# Patient Record
Sex: Female | Born: 1995 | Race: Black or African American | Hispanic: No | Marital: Single | Smoking: Never smoker
Health system: Southern US, Community
[De-identification: ages and names within clinical notes are randomized; demographics above are authoritative.]

## PROBLEM LIST (undated history)

## (undated) DIAGNOSIS — J45909 Unspecified asthma, uncomplicated: Secondary | ICD-10-CM

---
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
## 2016-02-05 ENCOUNTER — Emergency Department (HOSPITAL_COMMUNITY)
Admission: EM | Admit: 2016-02-05 | Discharge: 2016-02-05 | Disposition: A | Discharge status: Home / Self Care | Payer: Medicaid Other | Attending: Emergency Medicine | Admitting: Emergency Medicine

## 2016-02-05 ENCOUNTER — Encounter (HOSPITAL_COMMUNITY): Payer: Self-pay | Admitting: *Deleted

## 2016-02-05 DIAGNOSIS — J45909 Unspecified asthma, uncomplicated: Secondary | ICD-10-CM | POA: Diagnosis not present

## 2016-02-05 DIAGNOSIS — S299XXA Unspecified injury of thorax, initial encounter: Secondary | ICD-10-CM | POA: Diagnosis present

## 2016-02-05 DIAGNOSIS — Y939 Activity, unspecified: Secondary | ICD-10-CM | POA: Diagnosis not present

## 2016-02-05 DIAGNOSIS — Y9259 Other trade areas as the place of occurrence of the external cause: Secondary | ICD-10-CM | POA: Insufficient documentation

## 2016-02-05 DIAGNOSIS — Z79899 Other long term (current) drug therapy: Secondary | ICD-10-CM | POA: Insufficient documentation

## 2016-02-05 DIAGNOSIS — X500XXA Overexertion from strenuous movement or load, initial encounter: Secondary | ICD-10-CM | POA: Diagnosis not present

## 2016-02-05 DIAGNOSIS — N949 Unspecified condition associated with female genital organs and menstrual cycle: Secondary | ICD-10-CM

## 2016-02-05 DIAGNOSIS — N760 Acute vaginitis: Secondary | ICD-10-CM | POA: Insufficient documentation

## 2016-02-05 DIAGNOSIS — B9689 Other specified bacterial agents as the cause of diseases classified elsewhere: Secondary | ICD-10-CM

## 2016-02-05 DIAGNOSIS — Y990 Civilian activity done for income or pay: Secondary | ICD-10-CM | POA: Insufficient documentation

## 2016-02-05 DIAGNOSIS — S29012A Strain of muscle and tendon of back wall of thorax, initial encounter: Secondary | ICD-10-CM | POA: Diagnosis not present

## 2016-02-05 LAB — URINE MICROSCOPIC-ADD ON

## 2016-02-05 LAB — URINALYSIS, ROUTINE W REFLEX MICROSCOPIC
Glucose, UA: NEGATIVE mg/dL
Ketones, ur: 15 mg/dL — AB
LEUKOCYTES UA: NEGATIVE
Nitrite: NEGATIVE
PROTEIN: NEGATIVE mg/dL
SPECIFIC GRAVITY, URINE: 1.031 — AB (ref 1.005–1.030)
pH: 5.5 (ref 5.0–8.0)

## 2016-02-05 LAB — WET PREP, GENITAL
SPERM: NONE SEEN
TRICH WET PREP: NONE SEEN
Yeast Wet Prep HPF POC: NONE SEEN

## 2016-02-05 LAB — SYPHILIS: RPR W/REFLEX TO RPR TITER AND TREPONEMAL ANTIBODIES, TRADITIONAL SCREENING AND DIAGNOSIS ALGORITHM: RPR Ser Ql: NONREACTIVE

## 2016-02-05 LAB — HIV ANTIBODY (ROUTINE TESTING W REFLEX): HIV Screen 4th Generation wRfx: NONREACTIVE

## 2016-02-05 LAB — I-STAT BETA HCG BLOOD, ED (MC, WL, AP ONLY): I-stat hCG, quantitative: <5 m[IU]/mL

## 2016-02-05 MED ORDER — TRAMADOL HCL 50 MG PO TABS: 50.0000 mg | ORAL_TABLET | Freq: Four times a day (QID) | ORAL | 0 refills | Status: DC | PRN

## 2016-02-05 MED ORDER — LIDOCAINE HCL (PF) 1 % IJ SOLN
INTRAMUSCULAR | Status: AC
  Filled 2016-02-05: qty 5

## 2016-02-05 MED ORDER — AZITHROMYCIN 250 MG PO TABS
1000.0000 mg | ORAL_TABLET | Freq: Once | ORAL | Status: AC
  Administered 2016-02-05: 1000 mg via ORAL
  Filled 2016-02-05: qty 4

## 2016-02-05 MED ORDER — METRONIDAZOLE 500 MG PO TABS: 500.0000 mg | ORAL_TABLET | Freq: Two times a day (BID) | ORAL | 0 refills | Status: DC

## 2016-02-05 MED ORDER — LIDOCAINE HCL (PF) 1 % IJ SOLN
1.0000 mL | Freq: Once | INTRAMUSCULAR | Status: AC
  Administered 2016-02-05: 1 mL

## 2016-02-05 MED ORDER — VALACYCLOVIR HCL 500 MG PO TABS
1000.0000 mg | ORAL_TABLET | Freq: Once | ORAL | Status: AC
  Administered 2016-02-05: 1000 mg via ORAL
  Filled 2016-02-05 (×2): qty 2

## 2016-02-05 MED ORDER — CEFTRIAXONE SODIUM 250 MG IJ SOLR
250.0000 mg | Freq: Once | INTRAMUSCULAR | Status: AC
  Administered 2016-02-05: 250 mg via INTRAMUSCULAR
  Filled 2016-02-05: qty 250

## 2016-02-05 MED ORDER — VALACYCLOVIR HCL 1 G PO TABS: 1000.0000 mg | ORAL_TABLET | Freq: Three times a day (TID) | ORAL | 0 refills | Status: AC

## 2016-02-05 NOTE — ED Provider Notes (Signed)
[facility] Provider Note   CSN: [id number] Arrival date & time: 02/05/16  [id number]     History   Chief Complaint Chief Complaint  Patient presents with  . Back Pain  . Exposure to STD    HPI Jasmine Juarez is a 20 y.o. female with no significant past medical history presents to the ED today complaining of back pain and genital lesions. 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 Pain is worsened with movement. She has been taking Tylenol and/or aspirin about 3 times a week without relief of her symptoms. She has also been applying heating pad intermittently without relief. She denies any bowel or bladder incontinence, saddle anesthesia.  Patient also states that last night she was taking a shower and washing her genitalia with soap when she noticed that it began burning. When she went to evaluate that she felt some "bumps" that she states started at her anus and trace to her vagina. Patient states when she urinates it burns and when the urine goes over the bumps this burning to swell. She denies any vaginal discharge. She is currently menstruating. She does not use contraception. She states she is sexually active with one female partner for the last 3 months. However, she states that she recently got into an artery with him over him cheating on her so she is concerned this may be due to that. She denies any abdominal pain, vomiting, fevers.  HPI  Past Medical History:  Diagnosis Date  . Asthma     There are no active problems to display for this patient.   History reviewed. No pertinent surgical history.  OB History    No data available       Home Medications    Prior to Admission medications   Medication Sig Start Date End Date Taking? Authorizing Provider  cephALEXin (KEFLEX) 500 MG capsule Take 2 capsules (1,000 mg total) by mouth 2 (two) times daily. 07/10/15   Fayrene HelperBowie  Tran, PA-C  cetirizine (ZYRTEC ALLERGY) 10 MG tablet Take 1 tablet (10 mg total) by mouth daily. 07/16/15   Everlene FarrierWilliam Dansie, PA-C  fluticasone (FLONASE) 50 MCG/ACT nasal spray Place 2 sprays into both nostrils daily. 07/16/15   Everlene FarrierWilliam Dansie, PA-C  guaiFENesin-codeine (ROBITUSSIN AC) 100-10 MG/5ML syrup Take 5 mLs by mouth 3 (three) times daily as needed for cough. 05/23/15   Hope Orlene OchM Neese, NP  ibuprofen (ADVIL,MOTRIN) 100 MG/5ML suspension Take 20 mLs (400 mg total) by mouth every 6 (six) hours. 12/19/15   Audry Piliyler Mohr, PA-C  naproxen (NAPROSYN) 250 MG tablet Take 1 tablet (250 mg total) by mouth 2 (two) times daily with a meal. 07/16/15   Everlene FarrierWilliam Dansie, PA-C  ondansetron (ZOFRAN) 4 MG tablet Take 1 tablet (4 mg total) by mouth every 6 (six) hours. 05/23/15   Hope Orlene OchM Neese, NP    Family History No family history on file.  Social History Social History  Substance Use Topics  . Smoking status: Never Smoker  . Smokeless tobacco: Never Used  . Alcohol use No     Allergies   Patient has no known allergies.   Review of Systems Review of Systems  All other systems reviewed and are negative.    Physical Exam Updated Vital Signs BP 106/74 (BP Location: Left Arm)   Pulse 84   Temp 98 F (36.7 C) (Oral)   Resp 17  Ht 5\' 4"  (1.626 m)   Wt 59.9 kg   LMP 02/05/2016   SpO2 100%   BMI 22.66 kg/m   Physical Exam  Constitutional: She is oriented to person, place, and time. She appears well-developed and well-nourished. No distress.  HENT:  Head: Normocephalic and atraumatic.  Mouth/Throat: No oropharyngeal exudate.  Eyes: Conjunctivae and EOM are normal. Pupils are equal, round, and reactive to light. Right eye exhibits no discharge. Left eye exhibits no discharge. No scleral icterus.  Cardiovascular: Normal rate, regular rhythm, normal heart sounds and intact distal pulses.  Exam reveals no gallop and no friction rub.   No murmur heard. Pulmonary/Chest: Effort normal and breath sounds  normal.  Abdominal: Soft. She exhibits no distension. There is no tenderness. There is no guarding.  Genitourinary: Vaginal discharge found.  Genitourinary Comments: Multiple external small vesicular lesions on b/l labia. Lesions very TTP.  No CMT. No adnexal tenderness.  Musculoskeletal: Normal range of motion. She exhibits no edema.  No midline spinal TTP. FROM of C, T, L spine. No step offs or obvious bony deformities. Negative SLR.    Neurological: She is alert and oriented to person, place, and time. No cranial nerve deficit.  Skin: Skin is warm and dry. No rash noted. She is not diaphoretic. No erythema. No pallor.  Psychiatric: She has a normal mood and affect. Her behavior is normal.  Nursing note and vitals reviewed.    ED Treatments / Results  Labs (all labs ordered are listed, but only abnormal results are displayed) Labs Reviewed  HSV CULTURE AND TYPING  URINE CULTURE  WET PREP, GENITAL  URINALYSIS, ROUTINE W REFLEX MICROSCOPIC (NOT AT [facility])  RPR  HIV ANTIBODY (ROUTINE TESTING)  I-STAT BETA HCG BLOOD, ED ([facility], [facility], [facility] ONLY)  GC/CHLAMYDIA PROBE AMP ([place]) NOT AT [facility]    EKG  EKG Interpretation None       Radiology No results found.  Procedures Procedures (including critical care time)  Medications Ordered in ED Medications - No data to display   Initial Impression / Assessment and Plan / ED Course  I have reviewed the triage vital signs and the nursing notes.  Pertinent labs & imaging results that were available during my care of the patient were reviewed by me and considered in my medical decision making (see chart for details).  Clinical Course     Otherwise healthy 20 y.o F presents to the ED with multiple complaints. Pt c/o sore genital lesions with concern for STD. On exam, pt has multiple, painful vesicular lesions c/w HSV. Culture sent. No sign of PID. Will prophylactically treat with rocpehinn, azithro and flagyl. D/c on Valtrex. Recommend  f/u with PCP or health department. Pregnancy test negative. UA unremarkable.  Pt also c/o right upper back pain that has been present for approx 1 month. Pt works at a [employer] lifting heavy boxes which likely caused a muscle strain. No neurological deficits and normal neuro exam. Pt ambulatory without difficulty. No loss of bowel or bladder control.  No concern for cauda equina.  No fever, night sweats, weight loss, h/o cancer, IVDU.  RICE protocol and NSAIDS indicated and discussed with patient.   Return precautions outlined in patient discharge instructions.    Final Clinical Impressions(s) / ED Diagnoses   Final diagnoses:  Muscle strain of right upper back, initial encounter  Female genital lesion  Bacterial vaginosis    New Prescriptions Discharge Medication List as of 02/05/2016 11:57 AM    START taking  these medications   Details  metroNIDAZOLE (FLAGYL) 500 MG tablet Take 1 tablet (500 mg total) by mouth 2 (two) times daily., Starting Mon 02/05/2016, Print    traMADol (ULTRAM) 50 MG tablet Take 1 tablet (50 mg total) by mouth every 6 (six) hours as needed., Starting Mon 02/05/2016, Print    valACYclovir (VALTREX) 1000 MG tablet Take 1 tablet (1,000 mg total) by mouth 3 (three) times daily., Starting Mon 02/05/2016, Until Mon 02/19/2016, Print         Lester Kinsman Merwin, PA-C 02/15/16 1812    Loren Racer, MD 02/17/16 1650

## 2016-02-05 NOTE — ED Notes (Addendum)
Hurt back 2 months ago it burns and  Hurts all the time tried otc meds but it did not help,  also Felt bumps when she washed her bottom yesterday and she wants that checked, deneies vag d/c but does hurt to void, states is o0n her period now

## 2016-02-05 NOTE — ED Notes (Signed)
Pelvic done with speculum and bimanual , pt tolerated well, spec to lab

## 2016-02-05 NOTE — ED Triage Notes (Signed)
Pt's initial complaint was lower back pain that increases with movement x 2 months and no improvement with nsaids.  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  Also states she has a "situation down here and from what I've seen on google, it's not a good thing".  States bumps on perineum, tenderness and burning with urination.

## 2016-02-05 NOTE — Discharge Instructions (Signed)
Take Valtrex and Flagyl as prescribed. Do not drink while taking these medications. Use condoms for sexually active. It would avoid sexual intercourse until Gen. sores have healed. Please follow-up with your primary care provider in the next week for reevaluation. Apply ice to your upper back. Avoid heavy lifting. He may take tramadol and ibuprofen as needed for pain.

## 2016-02-05 NOTE — Progress Notes (Signed)
ED Cm consulted by ED PA about medication concern for pt with new dx, pt not having money and not wanting to tell her mother Cm discussed [facility] without a program to assist with medicaid medicine co pays less then $3 Discussed pt may be able to ask her pharmacy to credit her for medication, try financial resources, churches or other support systems for funding

## 2016-02-06 LAB — URINE CULTURE: Culture: NO GROWTH

## 2016-02-06 LAB — GC/CHLAMYDIA PROBE AMP (~~LOC~~) NOT AT ARMC
Chlamydia: POSITIVE — AB
Neisseria Gonorrhea: NEGATIVE

## 2016-02-07 LAB — HSV CULTURE AND TYPING

## 2016-05-22 ENCOUNTER — Emergency Department (HOSPITAL_COMMUNITY)
Admission: EM | Admit: 2016-05-22 | Discharge: 2016-05-22 | Disposition: A | Discharge status: Home / Self Care | Payer: Medicaid Other | Attending: Emergency Medicine | Admitting: Emergency Medicine

## 2016-05-22 ENCOUNTER — Emergency Department (HOSPITAL_COMMUNITY): Payer: Medicaid Other

## 2016-05-22 ENCOUNTER — Encounter (HOSPITAL_COMMUNITY): Payer: Self-pay | Admitting: Emergency Medicine

## 2016-05-22 DIAGNOSIS — J45909 Unspecified asthma, uncomplicated: Secondary | ICD-10-CM | POA: Diagnosis not present

## 2016-05-22 DIAGNOSIS — M940 Chondrocostal junction syndrome [Tietze]: Secondary | ICD-10-CM | POA: Diagnosis not present

## 2016-05-22 DIAGNOSIS — R079 Chest pain, unspecified: Secondary | ICD-10-CM | POA: Diagnosis present

## 2016-05-22 MED ORDER — NAPROXEN 500 MG PO TABS: 500.0000 mg | ORAL_TABLET | Freq: Two times a day (BID) | ORAL | 0 refills | Status: DC

## 2016-05-22 MED ORDER — IBUPROFEN 400 MG PO TABS
600.0000 mg | ORAL_TABLET | Freq: Once | ORAL | Status: AC
  Administered 2016-05-22: 14:00:00 600 mg via ORAL
  Filled 2016-05-22: qty 1

## 2016-05-22 NOTE — ED Provider Notes (Signed)
[facility] Provider Note   CSN: [id number] Arrival date & time: 05/22/16  1313     History   Chief Complaint Chief Complaint  Patient presents with  . Chest Pain    HPI Jasmine Juarez is a 21 y.o. female who presents to the Emergency Department with complaints of left-sided, twisting chest pain. She reports three episodes of similar pain over the last two years. The most recent episode began two weeks ago and was worse five days ago. Aggravating factors include inspiration and applying pressure to the area. Alleviating factors include taking hot showers. She denies abdominal pain, dyspnea, N/V/D, and palpitations. She has treated the pain at home with ibuprofen with temporary relief.   No chronic medical conditions. No daily medications. No pertinent surgical history. She is a never smoker.   HPI  Past Medical History:  Diagnosis Date  . Asthma     There are no active problems to display for this patient.   No past surgical history on file.  OB History    No data available       Home Medications    Prior to Admission medications   Medication Sig Start Date End Date Taking? Authorizing Provider  acetaminophen (TYLENOL) 325 MG tablet Take 650 mg by mouth every 6 (six) hours as needed for mild pain.    Historical Provider, MD  metroNIDAZOLE (FLAGYL) 500 MG tablet Take 1 tablet (500 mg total) by mouth 2 (two) times daily. 02/05/16   Samantha Tripp Dowless, PA-C  naproxen (NAPROSYN) 500 MG tablet Take 1 tablet (500 mg total) by mouth 2 (two) times daily. 05/22/16   Floy Riegler Conan BowensAdair Jozlyn Schatz, PA-C  naproxen (NAPROSYN) 500 MG tablet Take 1 tablet (500 mg total) by mouth 2 (two) times daily. 05/22/16   Rolland PorterMark James, MD  traMADol (ULTRAM) 50 MG tablet Take 1 tablet (50 mg total) by mouth every 6 (six) hours as needed. 02/05/16   Samantha Tripp Dowless, PA-C    Family History No family history on file.  Social History Social History  Substance Use Topics  . Smoking status:  Never Smoker  . Smokeless tobacco: Never Used  . Alcohol use No     Allergies   Patient has no known allergies.   Review of Systems Review of Systems  Constitutional: Negative for activity change.  Respiratory: Positive for shortness of breath. Negative for chest tightness and wheezing.   Cardiovascular: Positive for chest pain. Negative for palpitations.  Gastrointestinal: Negative for abdominal pain.  Musculoskeletal: Negative for back pain.  Skin: Negative for rash.  Allergic/Immunologic: Negative for immunocompromised state.  Neurological: Negative for dizziness and light-headedness.  Psychiatric/Behavioral: Negative for confusion.     Physical Exam Updated Vital Signs BP 112/75   Pulse 94   Temp 97.7 F (36.5 C)   Resp 18   Ht 5\' 4"  (1.626 m)   Wt 62.6 kg   LMP 05/02/2016   SpO2 100%   BMI 23.69 kg/m   Physical Exam  Constitutional: She is oriented to person, place, and time. She appears well-developed and well-nourished.  HENT:  Head: Normocephalic and atraumatic.  Eyes: Conjunctivae are normal.  Cardiovascular: Normal rate, regular rhythm and normal heart sounds.  Exam reveals no gallop and no friction rub.   No murmur heard. Pulmonary/Chest: Effort normal and breath sounds normal. No respiratory distress. She has no wheezes. She has no rales. She exhibits tenderness and bony tenderness. She exhibits no mass, no laceration, no crepitus, no edema and no swelling.  TTP to the anterior aspect of the left-chest wall along the rib, just inferior to the left breast. No overlying ecchymosis, rash, or abrasions.   Abdominal: Soft. Bowel sounds are normal. There is no tenderness.  Musculoskeletal: Normal range of motion. She exhibits no deformity.  Neurological: She is alert and oriented to person, place, and time.  Skin: Skin is warm and dry. No erythema.  Nursing note and vitals reviewed.  ED Treatments / Results  Labs (all labs ordered are listed, but only  abnormal results are displayed) Labs Reviewed - No data to display  EKG  EKG Interpretation None      Radiology Dg Chest 2 View  Result Date: 05/22/2016 CLINICAL DATA:  Intermittent LEFT-sided pain. Pleuritic pain for 2 weeks EXAM: CHEST  2 VIEW COMPARISON:  08/16/2013 FINDINGS: Normal mediastinum and cardiac silhouette. Normal pulmonary vasculature. No evidence of effusion, infiltrate, or pneumothorax. No acute bony abnormality. Scoliosis IMPRESSION: No acute cardiopulmonary process. Electronically Signed   By: Genevive Bi M.D.   On: 05/22/2016 14:58    Procedures Procedures (including critical care time)  Medications Ordered in ED Medications  ibuprofen (ADVIL,MOTRIN) tablet 600 mg (600 mg Oral Given 05/22/16 1421)    Initial Impression / Assessment and Plan / ED Course  I have reviewed the triage vital signs and the nursing notes.  Pertinent labs & imaging results that were available during my care of the patient were reviewed by me and considered in my medical decision making (see chart for details).     21 y.o. female with a h/o of left-sided anterior pleuritic chest wall pain that began two weeks ago. She reports the pain was worse five days ago, and has somewhat improved over the last few days, but continues to intermittently persist. She notes improvement with hot showers and ibuprofen. EKG not concerning for pericarditis or PE. No additional risk factors for PE. Will treat for costochondritis and discharge the patient to home with naprosyn and education about stretching exercises. Discussed results, findings, treatment and follow up. Patient advised of return precautions. Patient verbalized understanding and agreed with plan.  Final Clinical Impressions(s) / ED Diagnoses   Final diagnoses:  Costochondritis    New Prescriptions Discharge Medication List as of 05/22/2016  3:28 PM       Wendolyn Raso Conan Bowens, PA-C 05/22/16 2100    Rolland Porter, MD 06/05/16 1205

## 2016-05-22 NOTE — ED Notes (Signed)
Pt refusing vitals at this time. Pt states "I have to go. My grandmother will be here any minute." Pt verbalized understanding of discharge instructions.

## 2016-05-22 NOTE — ED Triage Notes (Signed)
Pt reports two weeks of non- radiating left chest pain. Pt reports Friday the pain got worst. Pt reports shortness of breath as well.

## 2016-05-22 NOTE — ED Provider Notes (Signed)
Pt seen and examined. Discussed with PA-C McDonald. Pt with 2 weeks of Lt lower CP. Pleuretic. Normal vital signs. EKG normal. Plan chest x-ray to rule out pneumothorax or infiltrate. Likely costochondritis and will require antibiotics.   Rolland PorterMark Clyda Smyth, MD 05/22/16 [phone]

## 2016-05-22 NOTE — Discharge Instructions (Signed)
Please return to the Emergency Department if you develop severe fever, shortness of breath, or severe pain. Please follow up with a primary care physician is symptoms persist after one week. Stretching and take anti-inflammatory medications to help with your symptoms.

## 2016-08-10 ENCOUNTER — Encounter (HOSPITAL_COMMUNITY): Payer: Self-pay | Admitting: Emergency Medicine

## 2016-08-10 ENCOUNTER — Emergency Department (HOSPITAL_COMMUNITY)
Admission: EM | Admit: 2016-08-10 | Discharge: 2016-08-10 | Disposition: A | Discharge status: Home / Self Care | Payer: Medicaid Other | Attending: Emergency Medicine | Admitting: Emergency Medicine

## 2016-08-10 DIAGNOSIS — J45909 Unspecified asthma, uncomplicated: Secondary | ICD-10-CM | POA: Diagnosis not present

## 2016-08-10 DIAGNOSIS — Z760 Encounter for issue of repeat prescription: Secondary | ICD-10-CM

## 2016-08-10 DIAGNOSIS — Z79899 Other long term (current) drug therapy: Secondary | ICD-10-CM | POA: Insufficient documentation

## 2016-08-10 DIAGNOSIS — R0789 Other chest pain: Secondary | ICD-10-CM | POA: Diagnosis present

## 2016-08-10 MED ORDER — METHOCARBAMOL 500 MG PO TABS: 500.0000 mg | ORAL_TABLET | Freq: Every evening | ORAL | 0 refills | Status: DC | PRN

## 2016-08-10 MED ORDER — NAPROXEN 500 MG PO TABS: 500.0000 mg | ORAL_TABLET | Freq: Two times a day (BID) | ORAL | 0 refills | Status: DC

## 2016-08-10 NOTE — ED Notes (Signed)
Declined W/C at D/C and was escorted to lobby by RN. 

## 2016-08-10 NOTE — ED Triage Notes (Signed)
Pt reports here for refill of medication she received for costochondritis. Pt reports pain continues and is out of prescribed medication.

## 2016-08-10 NOTE — ED Provider Notes (Signed)
[facility]-EMERGENCY DEPT Provider Note   CSN: [id number] Arrival date & time: 08/10/16  1144  By signing my name below, I, Diona Browner, attest that this documentation has been prepared under the direction and in the presence of Terance Hart, PA-C. Electronically Signed: Diona Browner, ED Scribe. 08/10/16. 1:23 PM.  History   Chief Complaint Chief Complaint  Patient presents with  . Medication Refill    HPI Jasmine Juarez is a 21 y.o. female who presents to the Emergency Department complaining of intermittent chest pain that started the other day. Pt reports being dx with costochondritis back in March 2018 and was given Naproxen. Pain feels consistent with this. Associated sx include SOB and pain with coughing. She is all out of her prescribed medication from the last time she was in the ED. Pt denies fever, nausea, and vomiting.  The history is provided by the patient. No language interpreter was used.    Past Medical History:  Diagnosis Date  . Asthma     There are no active problems to display for this patient.   History reviewed. No pertinent surgical history.  OB History    No data available       Home Medications    Prior to Admission medications   Medication Sig Start Date End Date Taking? Authorizing Provider  acetaminophen (TYLENOL) 325 MG tablet Take 650 mg by mouth every 6 (six) hours as needed for mild pain.    [provider]  metroNIDAZOLE (FLAGYL) 500 MG tablet Take 1 tablet (500 mg total) by mouth 2 (two) times daily. 02/05/16   Dowless, Samantha Tripp, PA-C  naproxen (NAPROSYN) 500 MG tablet Take 1 tablet (500 mg total) by mouth 2 (two) times daily. 05/22/16   McDonald, Mia A, PA-C  naproxen (NAPROSYN) 500 MG tablet Take 1 tablet (500 mg total) by mouth 2 (two) times daily. 05/22/16   Rolland Porter, MD  traMADol (ULTRAM) 50 MG tablet Take 1 tablet (50 mg total) by mouth every 6 (six) hours as needed. 02/05/16   Dowless, Lester Kinsman, PA-C    Family  History No family history on file.  Social History Social History  Substance Use Topics  . Smoking status: Never Smoker  . Smokeless tobacco: Never Used  . Alcohol use No     Allergies   Patient has no known allergies.   Review of Systems Review of Systems  Constitutional: Negative for fever.  Respiratory: Positive for cough and shortness of breath.   Cardiovascular: Positive for chest pain.  Gastrointestinal: Negative for nausea and vomiting.     Physical Exam Updated Vital Signs BP 96/71   Pulse 95   Temp 98.5 F (36.9 C) (Oral)   Resp 18   Ht 5\' 4"  (1.626 m)   Wt 140 lb (63.5 kg)   LMP 07/14/2016   SpO2 99%   BMI 24.03 kg/m   Physical Exam  Constitutional: She is oriented to person, place, and time. She appears well-developed and well-nourished. No distress.  HENT:  Head: Normocephalic and atraumatic.  Eyes: Conjunctivae and EOM are normal. Pupils are equal, round, and reactive to light. Right eye exhibits no discharge. Left eye exhibits no discharge. No scleral icterus.  Neck: Normal range of motion.  Cardiovascular: Normal rate, regular rhythm and normal heart sounds.  Exam reveals no gallop and no friction rub.   No murmur heard. Pulmonary/Chest: Effort normal and breath sounds normal. No respiratory distress. She has no wheezes. She has no rales. She exhibits tenderness.  Tenderness over left chest wall.  Abdominal: She exhibits no distension.  Musculoskeletal: Normal range of motion.  Neurological: She is alert and oriented to person, place, and time.  Skin: Skin is warm and dry.  Psychiatric: She has a normal mood and affect. Her behavior is normal.  Nursing note and vitals reviewed.    ED Treatments / Results  DIAGNOSTIC STUDIES: Oxygen Saturation is 99% on RA, normal by my interpretation.   COORDINATION OF CARE: 1:23 PM-Discussed next steps with pt which include taking a muscle relaxer and naproxen. Pt verbalized understanding and is  agreeable with the plan.   Labs (all labs ordered are listed, but only abnormal results are displayed) Labs Reviewed - No data to display  EKG  EKG Interpretation None       Radiology No results found.  Procedures Procedures (including critical care time)  Medications Ordered in ED Medications - No data to display   Initial Impression / Assessment and Plan / ED Course  I have reviewed the triage vital signs and the nursing notes.  Pertinent labs & imaging results that were available during my care of the patient were reviewed by me and considered in my medical decision making (see chart for details).  21 year old female with chest wall pain. Vitals are normal. It's unusual that her pain has been present since March however she reports it is the same pain. Exam is unremarkable. She was given Naproxen which has provided good relief. I will refill this and rx a muscle relaxer but advised that Naproxen is OTC. She verbalized understanding. Return precautions given.  Final Clinical Impressions(s) / ED Diagnoses   Final diagnoses:  Medication refill  Chest wall pain    New Prescriptions New Prescriptions   No medications on file   I personally performed the services described in this documentation, which was scribed in my presence. The recorded information has been reviewed and is accurate.    Bethel BornGekas, Moniqua Engebretsen Marie, PA-C 08/11/16 [id number]    Doug SouJacubowitz, Sam, MD 08/11/16 [phone]

## 2016-08-10 NOTE — Discharge Instructions (Signed)
Take Naproxen twice a day Take muscle relaxer as needed

## 2017-02-13 ENCOUNTER — Emergency Department (HOSPITAL_COMMUNITY)
Admission: EM | Admit: 2017-02-13 | Discharge: 2017-02-13 | Disposition: A | Discharge status: Home / Self Care | Payer: Medicaid Other | Attending: Emergency Medicine | Admitting: Emergency Medicine

## 2017-02-13 ENCOUNTER — Other Ambulatory Visit: Payer: Self-pay

## 2017-02-13 ENCOUNTER — Encounter (HOSPITAL_COMMUNITY): Payer: Self-pay | Admitting: Emergency Medicine

## 2017-02-13 DIAGNOSIS — J45909 Unspecified asthma, uncomplicated: Secondary | ICD-10-CM | POA: Insufficient documentation

## 2017-02-13 DIAGNOSIS — J029 Acute pharyngitis, unspecified: Secondary | ICD-10-CM | POA: Diagnosis present

## 2017-02-13 DIAGNOSIS — Z79899 Other long term (current) drug therapy: Secondary | ICD-10-CM | POA: Insufficient documentation

## 2017-02-13 LAB — RAPID STREP SCREEN (MED CTR MEBANE ONLY): STREPTOCOCCUS, GROUP A SCREEN (DIRECT): NEGATIVE

## 2017-02-13 MED ORDER — IBUPROFEN 400 MG PO TABS
600.0000 mg | ORAL_TABLET | Freq: Once | ORAL | Status: AC
  Administered 2017-02-13: 20:00:00 600 mg via ORAL
  Filled 2017-02-13: qty 1

## 2017-02-13 MED ORDER — DEXAMETHASONE 4 MG PO TABS
10.0000 mg | ORAL_TABLET | Freq: Once | ORAL | Status: AC
  Administered 2017-02-13: 10 mg via ORAL
  Filled 2017-02-13: qty 3

## 2017-02-13 MED ORDER — IBUPROFEN 600 MG PO TABS: 600.0000 mg | ORAL_TABLET | Freq: Four times a day (QID) | ORAL | 0 refills | Status: DC | PRN

## 2017-02-13 MED ORDER — DEXAMETHASONE 1 MG/ML PO CONC: 10.0000 mg | Freq: Once | ORAL | Status: DC
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
## 2017-02-13 NOTE — Discharge Instructions (Signed)
Please rest, drink plenty of fluids Take ibuprofen three times daily for pain

## 2017-02-16 LAB — CULTURE, GROUP A STREP (THRC)

## 2017-11-18 ENCOUNTER — Emergency Department (HOSPITAL_COMMUNITY)
Admission: EM | Admit: 2017-11-18 | Discharge: 2017-11-18 | Disposition: A | Discharge status: Home / Self Care | Payer: Medicaid Other | Attending: Emergency Medicine | Admitting: Emergency Medicine

## 2017-11-18 ENCOUNTER — Other Ambulatory Visit: Payer: Self-pay

## 2017-11-18 ENCOUNTER — Encounter (HOSPITAL_COMMUNITY): Payer: Self-pay | Admitting: Emergency Medicine

## 2017-11-18 ENCOUNTER — Emergency Department (HOSPITAL_COMMUNITY): Payer: Medicaid Other

## 2017-11-18 DIAGNOSIS — Z79899 Other long term (current) drug therapy: Secondary | ICD-10-CM | POA: Insufficient documentation

## 2017-11-18 DIAGNOSIS — M79671 Pain in right foot: Secondary | ICD-10-CM | POA: Diagnosis not present

## 2017-11-18 NOTE — ED Provider Notes (Signed)
[facility] Provider Note  CSN: [id number] Arrival date & time: 11/18/17  1304    History   Chief Complaint Chief Complaint  Patient presents with  . Foot Pain    HPI Jasmine Juarez is a 22 y.o. female with no significant medical history who presented to the ED for right foot pain x7 months. She describes throbbing, aching pain that first began in 04/2017 after a cash register fell on her foot at work. She has had no issues with ambulation since the accident and states that the pain has been minimal. She reports that her foot has become more bothersome over the last 3 days. Pain is worse with weight bearing. No specific relieving factors. Denies any new trauma or injury since 04/2017, but reports being on her feet a lot at work. Denies color or temperature change in the foot, paresthesias, foot drop, weakness, gait/coordination/balance issues. She denies previous medical evaluation or intervention prior to today.   Past Medical History:  Diagnosis Date  . Asthma     There are no active problems to display for this patient.   History reviewed. No pertinent surgical history.   OB History   None      Home Medications    Prior to Admission medications   Medication Sig Start Date End Date Taking? Authorizing Provider  acetaminophen (TYLENOL) 325 MG tablet Take 650 mg by mouth every 6 (six) hours as needed for mild pain.    [provider]  ibuprofen (ADVIL,MOTRIN) 600 MG tablet Take 1 tablet (600 mg total) by mouth every 6 (six) hours as needed. 02/13/17   Bethel BornGekas, Kelly Marie, PA-C  methocarbamol (ROBAXIN) 500 MG tablet Take 1 tablet (500 mg total) by mouth at bedtime and may repeat dose one time if needed. 08/10/16   Bethel BornGekas, Kelly Marie, PA-C  metroNIDAZOLE (FLAGYL) 500 MG tablet Take 1 tablet (500 mg total) by mouth 2 (two) times daily. 02/05/16   Dowless, Samantha Tripp, PA-C  naproxen (NAPROSYN) 500 MG tablet Take 1 tablet (500 mg  total) by mouth 2 (two) times daily. 08/10/16   Bethel BornGekas, Kelly Marie, PA-C    Family History History reviewed. No pertinent family history.  Social History Social History   Tobacco Use  . Smoking status: Never Smoker  . Smokeless tobacco: Never Used  Substance Use Topics  . Alcohol use: No  . Drug use: No     Allergies   Patient has no known allergies.   Review of Systems Review of Systems  Constitutional: Negative.   Musculoskeletal: Positive for arthralgias. Negative for gait problem and joint swelling.  Skin: Negative.   Neurological: Negative for weakness and numbness.  Hematological: Negative.    Physical Exam Updated Vital Signs BP 107/73 (BP Location: Right Arm)   Pulse 70   Temp 99.6 F (37.6 C) (Oral)   Resp 16   SpO2 100%   Physical Exam  Constitutional: Vital signs are normal. She appears well-developed and well-nourished.  Cardiovascular:  Pulses:      Dorsalis pedis pulses are 2+ on the right side, and 2+ on the left side.       Posterior tibial pulses are 2+ on the right side, and 2+ on the left side.  Musculoskeletal:       Right ankle: Normal. She exhibits normal range of motion and no swelling. No tenderness. Achilles tendon normal.       Right foot: There is tenderness and bony tenderness. There is normal range  of motion, no swelling, no crepitus and no deformity.       Left foot: There is normal range of motion and no deformity.  Full ROM of lower extremities (including ankles, feet and toes) with 5/5 strength. Generalized tenderness along the dorsal aspect of the right foot with increased tenderness along the MCP of the great toe. No swelling of right foot or ankle. Able to ambulate and bear full weight without issue.  Feet:  Right Foot:  Skin Integrity: Negative for erythema or warmth.  Left Foot:  Skin Integrity: Negative for erythema or warmth.  Neurological: She has normal strength. She displays no atrophy. No sensory deficit. She exhibits  normal muscle tone. Gait normal.  Reflex Scores:      Patellar reflexes are 2+ on the right side and 2+ on the left side.      Achilles reflexes are 2+ on the left side. Nursing note and vitals reviewed.  ED Treatments / Results  Labs (all labs ordered are listed, but only abnormal results are displayed) Labs Reviewed - No data to display  EKG None  Radiology Dg Foot Complete Right  Result Date: 11/18/2017 CLINICAL DATA:  Dropped a register on the foot a few months ago with persistent pain. EXAM: RIGHT FOOT COMPLETE - 3+ VIEW COMPARISON:  None. FINDINGS: There is no evidence of fracture or dislocation. There is no evidence of arthropathy or other focal bone abnormality. Soft tissues are unremarkable. IMPRESSION: Negative. Electronically Signed   By: Marnee Spring M.D.   On: 11/18/2017 13:41    Procedures Procedures (including critical care time)  Medications Ordered in ED Medications - No data to display   Initial Impression / Assessment and Plan / ED Course  Triage vital signs and the nursing notes have been reviewed.  Pertinent labs & imaging results that were available during care of the patient were reviewed and considered in medical decision making (see chart for details).  Patient is in no distress and well appearing. Patient has full sensation in right lower extremity, foot and ankle. She also has full active and passive ROM. No deformities, decreased muscle tone or other abnormalities visualized. Neurovascular function is intact. Physical exam and x-rays are reassuring. There are no other physical exam findings or s/s that suggest an underlying infectious or rheumatologic process that warrant further evaluation or intervention today.  Clinical Course as of Nov 19 1550  Tue Nov 18, 2017  1423 No significant findings on x-ray. No fractures or dislocations.   [GM]    Clinical Course User Index [GM] Nayra Coury, Sharyon Medicus, PA-C   Final Clinical Impressions(s) / ED  Diagnoses  1. Right Foot Pain. Education provided on OTC and supportive treatment for pain relief and inflammation.  Dispo: Home. After thorough clinical evaluation, this patient is determined to be medically stable and can be safely discharged with the previously mentioned treatment and/or outpatient follow-up/referral(s). At this time, there are no other apparent medical conditions that require further screening, evaluation or treatment.   Final diagnoses:  Right foot pain    ED Discharge Orders    None        Reva Bores 11/18/17 1552    Arby Barrette, MD 11/20/17 [phone]

## 2017-11-18 NOTE — ED Notes (Signed)
Patient verbalizes understanding of discharge instructions. Opportunity for questioning and answers were provided. Armband removed by staff, pt discharged from ED ambulatory.   

## 2017-11-18 NOTE — ED Triage Notes (Signed)
Pt presents to Ed for assessment of intermittent foot pain since February when patient dropped a register on her foot at work.  Patient states the pain has only ever been for a few hours "here and there".  Patient states it has been constant now since Sunday, patient limping in triage.

## 2017-11-18 NOTE — Discharge Instructions (Signed)
Your x-ray was normal. There are no broken bones or dislocations in your foot. Your pain is coming from muscle strain. Muscles take 4-6 weeks to heal when they are completely rested. Foot injuries take longer because we are on them frequently and they bear a lot of our body weight.  You may use Tylenol and/or Ibuprofen/Naproxen for pain relief and swelling. You may also use warm or cold compresses for additional relief.

## 2018-04-24 ENCOUNTER — Encounter (HOSPITAL_COMMUNITY): Payer: Self-pay

## 2018-04-24 ENCOUNTER — Emergency Department (HOSPITAL_COMMUNITY)
Admission: EM | Admit: 2018-04-24 | Discharge: 2018-04-24 | Disposition: A | Discharge status: Home / Self Care | Payer: Medicaid Other | Attending: Emergency Medicine | Admitting: Emergency Medicine

## 2018-04-24 ENCOUNTER — Other Ambulatory Visit: Payer: Self-pay

## 2018-04-24 DIAGNOSIS — J101 Influenza due to other identified influenza virus with other respiratory manifestations: Secondary | ICD-10-CM | POA: Insufficient documentation

## 2018-04-24 DIAGNOSIS — R6889 Other general symptoms and signs: Secondary | ICD-10-CM

## 2018-04-24 DIAGNOSIS — Z79899 Other long term (current) drug therapy: Secondary | ICD-10-CM | POA: Insufficient documentation

## 2018-04-24 DIAGNOSIS — J45909 Unspecified asthma, uncomplicated: Secondary | ICD-10-CM | POA: Insufficient documentation

## 2018-04-24 MED ORDER — BENZONATATE 100 MG PO CAPS: 100.0000 mg | ORAL_CAPSULE | Freq: Three times a day (TID) | ORAL | 0 refills | Status: DC | PRN

## 2018-04-24 MED ORDER — ALBUTEROL SULFATE HFA 108 (90 BASE) MCG/ACT IN AERS
2.0000 | INHALATION_SPRAY | Freq: Once | RESPIRATORY_TRACT | Status: AC
  Administered 2018-04-24: 2 via RESPIRATORY_TRACT
  Filled 2018-04-24: qty 6.7

## 2018-04-24 MED ORDER — AEROCHAMBER PLUS FLO-VU LARGE MISC
1.0000 | Freq: Once | Status: AC
  Administered 2018-04-24: 1

## 2018-04-24 NOTE — ED Triage Notes (Signed)
Pt reports gen body aches, fever, chills, runny nose and sore throat since Tuesday.

## 2018-04-24 NOTE — Discharge Instructions (Addendum)
Today your symptoms are consistent with a flu or a flulike illness.  As I did not test you for the flu we call this a flulike illness.  I have given you information to read on the flu as most of this still applies.  If you have not already, please consider getting a flu shot once you are well.  Please make sure to practice good hand hygiene to help prevent the spread of flu.  We discussed today that many symptoms of the flu such as fever, not feeling well, and body aches can also be the first signs of more serious illnesses or infections.  If you have any new or worsening symptoms please seek additional medical care and evaluation.    Please take Ibuprofen (Advil, motrin) and Tylenol (acetaminophen) to relieve your pain.  You may take up to 600 MG (3 pills) of normal strength ibuprofen every 8 hours as needed.  In between doses of ibuprofen you make take tylenol, up to 1,000 mg (two extra strength pills).  Do not take more than 3,000 mg tylenol in a 24 hour period.  Please check all medication labels as many medications such as pain and cold medications may contain tylenol.  Do not drink alcohol while taking these medications.  Do not take other NSAID'S while taking ibuprofen (such as aleve or naproxen).  Please take ibuprofen with food to decrease stomach upset.  Today you were given an inhaler while in the department.  You may use this by taking 2 puffs every 4 hours as needed for cough or shortness of breath.  I have also given you a prescription for Tessalon which is a cough medicine.

## 2018-04-24 NOTE — ED Provider Notes (Signed)
[facility] EMERGENCY DEPARTMENT Provider Note   CSN: [id number] Arrival date & time: 04/24/18  1744    History   Chief Complaint Chief Complaint  Patient presents with  . Generalized Body Aches  . Sore Throat    HPI Jasmine Juarez is a 23 y.o. female with a past medical history of asthma who presents today for evaluation of generally not feeling well.  She reports that on Tuesday she developed a sore throat with runny nose and postnasal drainage.  On Thursday she got headache generalized body aches and generally not feeling well.  Today she has had subjective fevers and fatigue.  She states that she has been around someone who has been sick.  She has not checked her temperature at home.  She did not get a flu shot this year.  She reports that she has a history of asthma, does not feel like she is short of breath or wheezing.  She denies any abdominal pain, chest pain, no nausea vomiting or diarrhea.  No urinary symptoms or vaginal discharge.  Cough is not productive.      HPI  Past Medical History:  Diagnosis Date  . Asthma     There are no active problems to display for this patient.   History reviewed. No pertinent surgical history.   OB History   No obstetric history on file.      Home Medications    Prior to Admission medications   Medication Sig Start Date End Date Taking? Authorizing Provider  acetaminophen (TYLENOL) 325 MG tablet Take 650 mg by mouth every 6 (six) hours as needed for mild pain.    [provider]  benzonatate (TESSALON) 100 MG capsule Take 1 capsule (100 mg total) by mouth every 8 (eight) hours as needed for cough. 04/24/18   Cristina Gong, PA-C  ibuprofen (ADVIL,MOTRIN) 600 MG tablet Take 1 tablet (600 mg total) by mouth every 6 (six) hours as needed. 02/13/17   Bethel Born, PA-C  methocarbamol (ROBAXIN) 500 MG tablet Take 1 tablet (500 mg total) by mouth at bedtime and may repeat dose one time if needed.  08/10/16   Bethel Born, PA-C  metroNIDAZOLE (FLAGYL) 500 MG tablet Take 1 tablet (500 mg total) by mouth 2 (two) times daily. 02/05/16   Dowless, Samantha Tripp, PA-C  naproxen (NAPROSYN) 500 MG tablet Take 1 tablet (500 mg total) by mouth 2 (two) times daily. 08/10/16   Bethel Born, PA-C    Family History No family history on file.  Social History Social History   Tobacco Use  . Smoking status: Never Smoker  . Smokeless tobacco: Never Used  Substance Use Topics  . Alcohol use: No  . Drug use: No     Allergies   Patient has no known allergies.   Review of Systems Review of Systems  Constitutional: Positive for fatigue and fever. Negative for chills.  HENT: Positive for congestion, rhinorrhea, sinus pressure, sinus pain and sore throat. Negative for tinnitus and trouble swallowing.   Eyes: Negative for visual disturbance.  Respiratory: Positive for cough. Negative for chest tightness and shortness of breath.   Cardiovascular: Negative for chest pain.  Gastrointestinal: Negative for abdominal pain, diarrhea, nausea and vomiting.  Musculoskeletal: Negative for back pain and neck pain.  Skin: Negative for color change and rash.  Neurological: Positive for headaches. Negative for dizziness, weakness and numbness.  All other systems reviewed and are negative.    Physical Exam Updated Vital  Signs BP 114/80 (BP Location: Left Arm)   Pulse 91   Temp 98.2 F (36.8 C) (Oral)   Resp 16   SpO2 100%   Physical Exam Vitals signs and nursing note reviewed.  Constitutional:      General: She is not in acute distress.    Appearance: She is well-developed. She is not diaphoretic.  HENT:     Head: Normocephalic and atraumatic.     Right Ear: Tympanic membrane, ear canal and external ear normal.     Left Ear: Tympanic membrane, ear canal and external ear normal.     Nose: No mucosal edema, congestion or rhinorrhea.     Mouth/Throat:     Mouth: Mucous membranes are  moist. No oral lesions.     Pharynx: Oropharynx is clear. Uvula midline. No oropharyngeal exudate.     Tonsils: No tonsillar exudate or tonsillar abscesses. Swelling: 1+ on the right. 1+ on the left.  Eyes:     General: No scleral icterus.    Conjunctiva/sclera: Conjunctivae normal.  Neck:     Musculoskeletal: Normal range of motion and neck supple.  Cardiovascular:     Rate and Rhythm: Normal rate and regular rhythm.     Heart sounds: Normal heart sounds. No murmur.  Pulmonary:     Effort: Pulmonary effort is normal. No respiratory distress.     Breath sounds: Normal breath sounds. No wheezing.  Chest:     Chest wall: No tenderness.  Abdominal:     General: Bowel sounds are normal. There is no distension.     Palpations: Abdomen is soft.  Lymphadenopathy:     Cervical: No cervical adenopathy.  Skin:    General: Skin is warm and dry.  Neurological:     General: No focal deficit present.     Mental Status: She is alert and oriented to person, place, and time.  Psychiatric:        Mood and Affect: Mood normal.        Behavior: Behavior normal.      ED Treatments / Results  Labs (all labs ordered are listed, but only abnormal results are displayed) Labs Reviewed - No data to display  EKG None  Radiology No results found.  Procedures Procedures (including critical care time)  Medications Ordered in ED Medications  albuterol (PROVENTIL HFA;VENTOLIN HFA) 108 (90 Base) MCG/ACT inhaler 2 puff (2 puffs Inhalation Given 04/24/18 2036)  AEROCHAMBER PLUS FLO-VU LARGE MISC 1 each (1 each Other Given 04/24/18 2036)     Initial Impression / Assessment and Plan / ED Course  I have reviewed the triage vital signs and the nursing notes.  Pertinent labs & imaging results that were available during my care of the patient were reviewed by me and considered in my medical decision making (see chart for details).       Patient with symptoms consistent with influenza.  Vitals are  stable, low-grade fever.  No signs of dehydration, tolerating PO's.  Lungs are clear. Due to patient's presentation and physical exam a chest x-ray was not ordered bc likely diagnosis of flu.  Discussed the cost versus benefit of Tamiflu treatment with the patient.  The patient understands that symptoms are greater than the recommended 24-48 hour window of treatment.  Patient will be discharged with instructions to orally hydrate, rest, and use over-the-counter medications such as anti-inflammatories ibuprofen and Aleve for muscle aches and Tylenol for fever.  Patient will also be given a cough suppressant.  Final Clinical Impressions(s) / ED Diagnoses   Final diagnoses:  Flu-like symptoms    ED Discharge Orders         Ordered    benzonatate (TESSALON) 100 MG capsule  Every 8 hours PRN     04/24/18 2029           Norman Clay 04/24/18 2303    Pricilla Loveless, MD 04/25/18 2329

## 2018-04-24 NOTE — ED Notes (Signed)
Patient verbalizes understanding of discharge instructions. Opportunity for questioning and answers were provided. Armband removed by staff, pt discharged from ED.  

## 2019-01-05 ENCOUNTER — Encounter (HOSPITAL_COMMUNITY): Payer: Self-pay | Admitting: Emergency Medicine

## 2019-01-05 ENCOUNTER — Emergency Department (HOSPITAL_COMMUNITY)
Admission: EM | Admit: 2019-01-05 | Discharge: 2019-01-05 | Disposition: A | Discharge status: Home / Self Care | Payer: Medicaid Other | Attending: Emergency Medicine | Admitting: Emergency Medicine

## 2019-01-05 ENCOUNTER — Other Ambulatory Visit: Payer: Self-pay

## 2019-01-05 DIAGNOSIS — Z79899 Other long term (current) drug therapy: Secondary | ICD-10-CM | POA: Insufficient documentation

## 2019-01-05 DIAGNOSIS — B349 Viral infection, unspecified: Secondary | ICD-10-CM | POA: Insufficient documentation

## 2019-01-05 DIAGNOSIS — J029 Acute pharyngitis, unspecified: Secondary | ICD-10-CM

## 2019-01-05 DIAGNOSIS — J45909 Unspecified asthma, uncomplicated: Secondary | ICD-10-CM | POA: Insufficient documentation

## 2019-01-05 DIAGNOSIS — Z20828 Contact with and (suspected) exposure to other viral communicable diseases: Secondary | ICD-10-CM | POA: Insufficient documentation

## 2019-01-05 LAB — GROUP A STREP BY PCR: Group A Strep by PCR: NOT DETECTED

## 2019-01-05 MED ORDER — ACETAMINOPHEN 325 MG PO TABS
650.0000 mg | ORAL_TABLET | Freq: Once | ORAL | Status: AC | PRN
  Administered 2019-01-05: 20:00:00 650 mg via ORAL
  Filled 2019-01-05: qty 2

## 2019-01-05 NOTE — ED Triage Notes (Signed)
Pt complains of sore throat, nasal congestion and fever that started today. Pt normally gets strep throat yearly- wants to see if she has it again.

## 2019-01-05 NOTE — Discharge Instructions (Signed)
Your strep test was negative. We have swabbed you for covid today. Please stay home and self isolate until you receive your results. If negative you can return to work immediately. If positive you will need to stay home and self isolate for 2 weeks starting today (cleared: 01/20/2019).   Continue taking Tylenol as needed for your fevers. Continue using OTC medications for your symptoms as well.   Follow up with your PCP. If you do not have one you can follow up with [facility] for your primary care needs.      Person Under Monitoring Name: Jasmine Juarez  Location: [address] [place] [address]   Infection Prevention Recommendations for Individuals Confirmed to have, or Being Evaluated for, 2019 Novel Coronavirus (COVID-19) Infection Who Receive Care at Home  Individuals who are confirmed to have, or are being evaluated for, COVID-19 should follow the prevention steps below until a healthcare provider or local or state health department says they can return to normal activities.  Stay home except to get medical care You should restrict activities outside your home, except for getting medical care. Do not go to work, school, or public areas, and do not use public transportation or taxis.  Call ahead before visiting your doctor Before your medical appointment, call the healthcare provider and tell them that you have, or are being evaluated for, COVID-19 infection. This will help the healthcare providers office take steps to keep other people from getting infected. Ask your healthcare provider to call the local or state health department.  Monitor your symptoms Seek prompt medical attention if your illness is worsening (e.g., difficulty breathing). Before going to your medical appointment, call the healthcare provider and tell them that you have, or are being evaluated for, COVID-19 infection. Ask your healthcare provider to call the local or state health  department.  Wear a facemask You should wear a facemask that covers your nose and mouth when you are in the same room with other people and when you visit a healthcare provider. People who live with or visit you should also wear a facemask while they are in the same room with you.  Separate yourself from other people in your home As much as possible, you should stay in a different room from other people in your home. Also, you should use a separate bathroom, if available.  Avoid sharing household items You should not share dishes, drinking glasses, cups, eating utensils, towels, bedding, or other items with other people in your home. After using these items, you should wash them thoroughly with soap and water.  Cover your coughs and sneezes Cover your mouth and nose with a tissue when you cough or sneeze, or you can cough or sneeze into your sleeve. Throw used tissues in a lined trash can, and immediately wash your hands with soap and water for at least 20 seconds or use an alcohol-based hand rub.  Wash your [employer] your hands often and thoroughly with soap and water for at least 20 seconds. You can use an alcohol-based hand sanitizer if soap and water are not available and if your hands are not visibly dirty. Avoid touching your eyes, nose, and mouth with unwashed hands.   Prevention Steps for Caregivers and Household Members of Individuals Confirmed to have, or Being Evaluated for, COVID-19 Infection Being Cared for in the Home  If you live with, or provide care at home for, a person confirmed to have, or being evaluated for, COVID-19  infection please follow these guidelines to prevent infection:  Follow healthcare providers instructions Make sure that you understand and can help the patient follow any healthcare provider instructions for all care.  Provide for the patients basic needs You should help the patient with basic needs in the home and provide support for getting  groceries, prescriptions, and other personal needs.  Monitor the patients symptoms If they are getting sicker, call his or her medical provider and tell them that the patient has, or is being evaluated for, COVID-19 infection. This will help the healthcare providers office take steps to keep other people from getting infected. Ask the healthcare provider to call the local or state health department.  Limit the number of people who have contact with the patient If possible, have only one caregiver for the patient. Other household members should stay in another home or place of residence. If this is not possible, they should stay in another room, or be separated from the patient as much as possible. Use a separate bathroom, if available. Restrict visitors who do not have an essential need to be in the home.  Keep older adults, very young children, and other sick people away from the patient Keep older adults, very young children, and those who have compromised immune systems or chronic health conditions away from the patient. This includes people with chronic heart, lung, or kidney conditions, diabetes, and cancer.  Ensure good ventilation Make sure that shared spaces in the home have good air flow, such as from an air conditioner or an opened window, weather permitting.  Wash your hands often Wash your hands often and thoroughly with soap and water for at least 20 seconds. You can use an alcohol based hand sanitizer if soap and water are not available and if your hands are not visibly dirty. Avoid touching your eyes, nose, and mouth with unwashed hands. Use disposable paper towels to dry your hands. If not available, use dedicated cloth towels and replace them when they become wet.  Wear a facemask and gloves Wear a disposable facemask at all times in the room and gloves when you touch or have contact with the patients blood, body fluids, and/or secretions or excretions, such as sweat,  saliva, sputum, nasal mucus, vomit, urine, or feces.  Ensure the mask fits over your nose and mouth tightly, and do not touch it during use. Throw out disposable facemasks and gloves after using them. Do not reuse. Wash your hands immediately after removing your facemask and gloves. If your personal clothing becomes contaminated, carefully remove clothing and launder. Wash your hands after handling contaminated clothing. Place all used disposable facemasks, gloves, and other waste in a lined container before disposing them with other household waste. Remove gloves and wash your hands immediately after handling these items.  Do not share dishes, glasses, or other household items with the patient Avoid sharing household items. You should not share dishes, drinking glasses, cups, eating utensils, towels, bedding, or other items with a patient who is confirmed to have, or being evaluated for, COVID-19 infection. After the person uses these items, you should wash them thoroughly with soap and water.  Wash laundry thoroughly Immediately remove and wash clothes or bedding that have blood, body fluids, and/or secretions or excretions, such as sweat, saliva, sputum, nasal mucus, vomit, urine, or feces, on them. Wear gloves when handling laundry from the patient. Read and follow directions on labels of laundry or clothing items and detergent. In general, wash and dry  with the warmest temperatures recommended on the label.  Clean all areas the individual has used often Clean all touchable surfaces, such as counters, tabletops, doorknobs, bathroom fixtures, toilets, phones, keyboards, tablets, and bedside tables, every day. Also, clean any surfaces that may have blood, body fluids, and/or secretions or excretions on them. Wear gloves when cleaning surfaces the patient has come in contact with. Use a diluted bleach solution (e.g., dilute bleach with 1 part bleach and 10 parts water) or a household disinfectant  with a label that says EPA-registered for coronaviruses. To make a bleach solution at home, add 1 tablespoon of bleach to 1 quart (4 cups) of water. For a larger supply, add  cup of bleach to 1 gallon (16 cups) of water. Read labels of cleaning products and follow recommendations provided on product labels. Labels contain instructions for safe and effective use of the cleaning product including precautions you should take when applying the product, such as wearing gloves or eye protection and making sure you have good ventilation during use of the product. Remove gloves and wash hands immediately after cleaning.  Monitor yourself for signs and symptoms of illness Caregivers and household members are considered close contacts, should monitor their health, and will be asked to limit movement outside of the home to the extent possible. Follow the monitoring steps for close contacts listed on the symptom monitoring form.   ? If you have additional questions, contact your local health department or call the epidemiologist on call at [phone] (available 24/7). ? This guidance is subject to change. For the most up-to-date guidance from [facility], please refer to their website: [email or web address]

## 2019-01-05 NOTE — ED Notes (Signed)
Patient verbalizes understanding of discharge instructions. Opportunity for questioning and answers were provided. Armband removed by staff, pt discharged from ED ambulatory.   

## 2019-01-05 NOTE — ED Provider Notes (Signed)
[facility] EMERGENCY DEPARTMENT Provider Note   CSN: [id number] Arrival date & time: 01/05/19  1840     History   Chief Complaint Chief Complaint  Patient presents with  . Sore Throat    HPI Jasmine Juarez is a 23 y.o. female who presents to the ED today complaining of gradual onset, constant, achy, sore throat that began lats night.  Reports that she woke up today and noted to have a fever with T-max 102.  She states she took Tylenol with relief.  Temperature in the ED 99 5.  She reports that she typically gets strep throat yearly around this time.  He reports this feels similar to her previous sore throats.  She has been using honey to soothe her throat as well as cough drops.  Recent sick contacts or COVID-19 positive exposure.  Denies chills, drooling, voice change, inability to swallow, ear pain, sinus pressure, cough, other associated symptoms.       Past Medical History:  Diagnosis Date  . Asthma     There are no active problems to display for this patient.   History reviewed. No pertinent surgical history.   OB History   No obstetric history on file.      Home Medications    Prior to Admission medications   Medication Sig Start Date End Date Taking? Authorizing Provider  acetaminophen (TYLENOL) 325 MG tablet Take 650 mg by mouth every 6 (six) hours as needed for mild pain.    [provider]  benzonatate (TESSALON) 100 MG capsule Take 1 capsule (100 mg total) by mouth every 8 (eight) hours as needed for cough. 04/24/18   Cristina GongHammond, Elizabeth W, PA-C  ibuprofen (ADVIL,MOTRIN) 600 MG tablet Take 1 tablet (600 mg total) by mouth every 6 (six) hours as needed. 02/13/17   Bethel BornGekas, Kelly Marie, PA-C  methocarbamol (ROBAXIN) 500 MG tablet Take 1 tablet (500 mg total) by mouth at bedtime and may repeat dose one time if needed. 08/10/16   Bethel BornGekas, Kelly Marie, PA-C  metroNIDAZOLE (FLAGYL) 500 MG tablet Take 1 tablet (500 mg total) by mouth 2 (two) times  daily. 02/05/16   Dowless, Samantha Tripp, PA-C  naproxen (NAPROSYN) 500 MG tablet Take 1 tablet (500 mg total) by mouth 2 (two) times daily. 08/10/16   Bethel BornGekas, Kelly Marie, PA-C    Family History No family history on file.  Social History Social History   Tobacco Use  . Smoking status: Never Smoker  . Smokeless tobacco: Never Used  Substance Use Topics  . Alcohol use: No  . Drug use: No     Allergies   Patient has no known allergies.   Review of Systems Review of Systems  Constitutional: Positive for fever. Negative for chills.  HENT: Positive for sore throat. Negative for ear pain, sinus pressure, sinus pain, trouble swallowing and voice change.   Respiratory: Negative for cough and shortness of breath.      Physical Exam Updated Vital Signs BP 115/76 (BP Location: Left Arm)   Temp 99.5 F (37.5 C) (Oral)   Resp 15   Ht 5\' 4"  (1.626 m)   Wt 66.7 kg   LMP 12/18/2018   SpO2 98%   BMI 25.23 kg/m   Physical Exam Vitals signs and nursing note reviewed.  Constitutional:      Appearance: She is not ill-appearing.  HENT:     Head: Normocephalic and atraumatic.     Right Ear: Tympanic membrane normal.     Left  Ear: Tympanic membrane normal.     Mouth/Throat:     Mouth: Mucous membranes are moist.     Tonsils: No tonsillar exudate or tonsillar abscesses.     Comments: Posterior oropharynx mildly erythematous; no edema or exudate. Uvula is midline.  Eyes:     Conjunctiva/sclera: Conjunctivae normal.  Neck:     Musculoskeletal: Normal range of motion.  Cardiovascular:     Rate and Rhythm: Normal rate and regular rhythm.     Heart sounds: Normal heart sounds.  Pulmonary:     Effort: Pulmonary effort is normal.     Breath sounds: Normal breath sounds. No wheezing, rhonchi or rales.  Lymphadenopathy:     Cervical: No cervical adenopathy.  Skin:    General: Skin is warm and dry.     Coloration: Skin is not jaundiced.  Neurological:     Mental Status: She is  alert.      ED Treatments / Results  Labs (all labs ordered are listed, but only abnormal results are displayed) Labs Reviewed  GROUP A STREP BY PCR  NOVEL CORONAVIRUS, NAA (HOSP ORDER, SEND-OUT TO REF LAB; TAT 18-24 HRS)    EKG None  Radiology No results found.  Procedures Procedures (including critical care time)  Medications Ordered in ED Medications  acetaminophen (TYLENOL) tablet 650 mg (has no administration in time range)     Initial Impression / Assessment and Plan / ED Course  I have reviewed the triage vital signs and the nursing notes.  Pertinent labs & imaging results that were available during my care of the patient were reviewed by me and considered in my medical decision making (see chart for details).    23 year old female who presents to the ED today complaining of sore throat.  History of strep throat in the past.  On exam her posterior oropharynx is mildly erythematous, no edema or exudate.  Arrival to the ED her temperature was at 100.  Recheck 99.5.  Patient states she has been taking Tylenol with relief.  T-max 102.  He is nontoxic-appearing today.  Nontachy and nontachypneic.  No red flags today concerning for deep space infection.  She is able to tolerate fluids without difficulty and is currently drinking orange juice at the bedside.  Will swab for strep throat at this time given history.  If negative patient likely experiencing viral pharyngitis.  I have advised symptomatic treatment for this.  Patient will be given referral to [facility] wellness that she does not have a PCP currently.  No known COVID-19 positive exposure.   Strep negative.  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  Have advised that she stay home and self isolate until she receives her results.  If positive she will  need to stay home for 2 weeks.  Patient advised to continue symptomatic treatment including Tylenol as needed for her fevers.  She has been given [place] [facility] and wellness resource.   This note was prepared using Dragon voice recognition software and may include unintentional dictation errors due to the inherent limitations of voice recognition software.Darlin Coco was evaluated in Emergency Department on 01/05/2019 for the symptoms described in the history of present illness. She was evaluated in the context of the global COVID-19 pandemic, which necessitated consideration that the patient might  be at risk for infection with the SARS-CoV-2 virus that causes COVID-19. Institutional protocols and algorithms that pertain to the evaluation of patients at risk for COVID-19 are in a state of rapid change based on information released by regulatory bodies including the CDC and federal and state organizations. These policies and algorithms were followed during the patient's care in the ED.    Final Clinical Impressions(s) / ED Diagnoses   Final diagnoses:  Viral pharyngitis  Viral illness    ED Discharge Orders    None       Tanda Rockers, PA-C 01/05/19 2022    Melene Plan, DO 01/05/19 2041

## 2019-01-07 LAB — NOVEL CORONAVIRUS, NAA (HOSP ORDER, SEND-OUT TO REF LAB; TAT 18-24 HRS): SARS-CoV-2, NAA: NOT DETECTED

## 2019-03-09 ENCOUNTER — Other Ambulatory Visit: Payer: Medicaid Other

## 2019-03-11 ENCOUNTER — Ambulatory Visit: Payer: Medicaid Other | Attending: Internal Medicine

## 2019-03-11 DIAGNOSIS — Z20822 Contact with and (suspected) exposure to covid-19: Secondary | ICD-10-CM

## 2019-03-13 LAB — NOVEL CORONAVIRUS, NAA: SARS-CoV-2, NAA: NOT DETECTED

## 2019-03-15 ENCOUNTER — Other Ambulatory Visit: Payer: Medicaid Other

## 2019-03-15 ENCOUNTER — Ambulatory Visit: Payer: Medicaid Other | Attending: Internal Medicine

## 2019-03-15 DIAGNOSIS — Z20822 Contact with and (suspected) exposure to covid-19: Secondary | ICD-10-CM

## 2019-03-16 LAB — NOVEL CORONAVIRUS, NAA: SARS-CoV-2, NAA: NOT DETECTED

## 2019-05-04 ENCOUNTER — Ambulatory Visit: Payer: Medicaid Other | Attending: Internal Medicine

## 2019-05-04 DIAGNOSIS — Z20822 Contact with and (suspected) exposure to covid-19: Secondary | ICD-10-CM

## 2019-05-05 LAB — NOVEL CORONAVIRUS, NAA: SARS-CoV-2, NAA: NOT DETECTED

## 2019-08-09 IMAGING — DX DG FOOT COMPLETE 3+V*R*
3 series · 3 of 3 positions shown · non-contrast
Comparison: None.

CLINICAL DATA: Dropped a register on the foot a few months ago with
persistent pain.

EXAM:
RIGHT FOOT COMPLETE - 3+ VIEW

[foot ap]
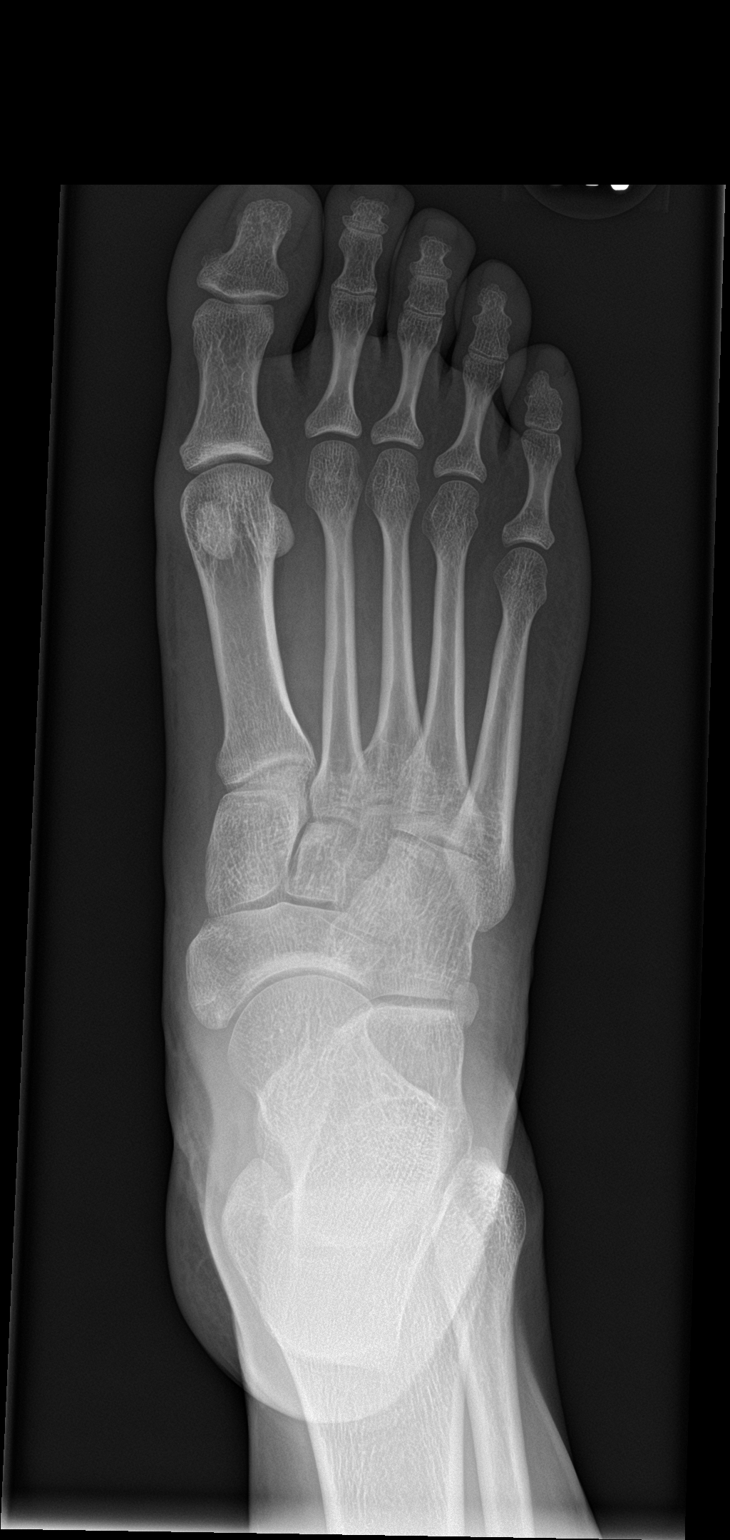

[foot obl]
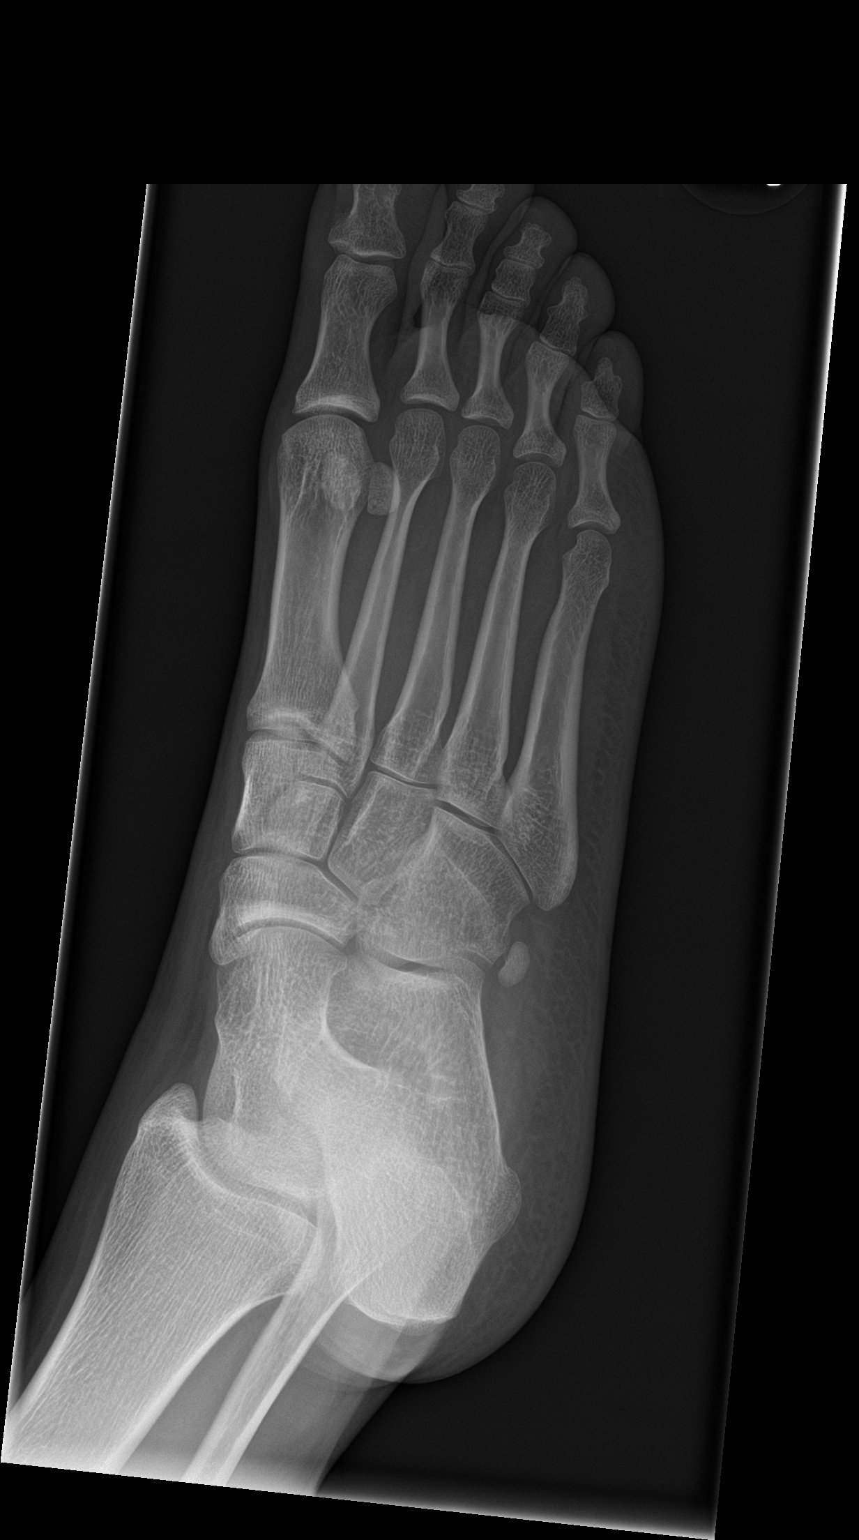

[foot lat]
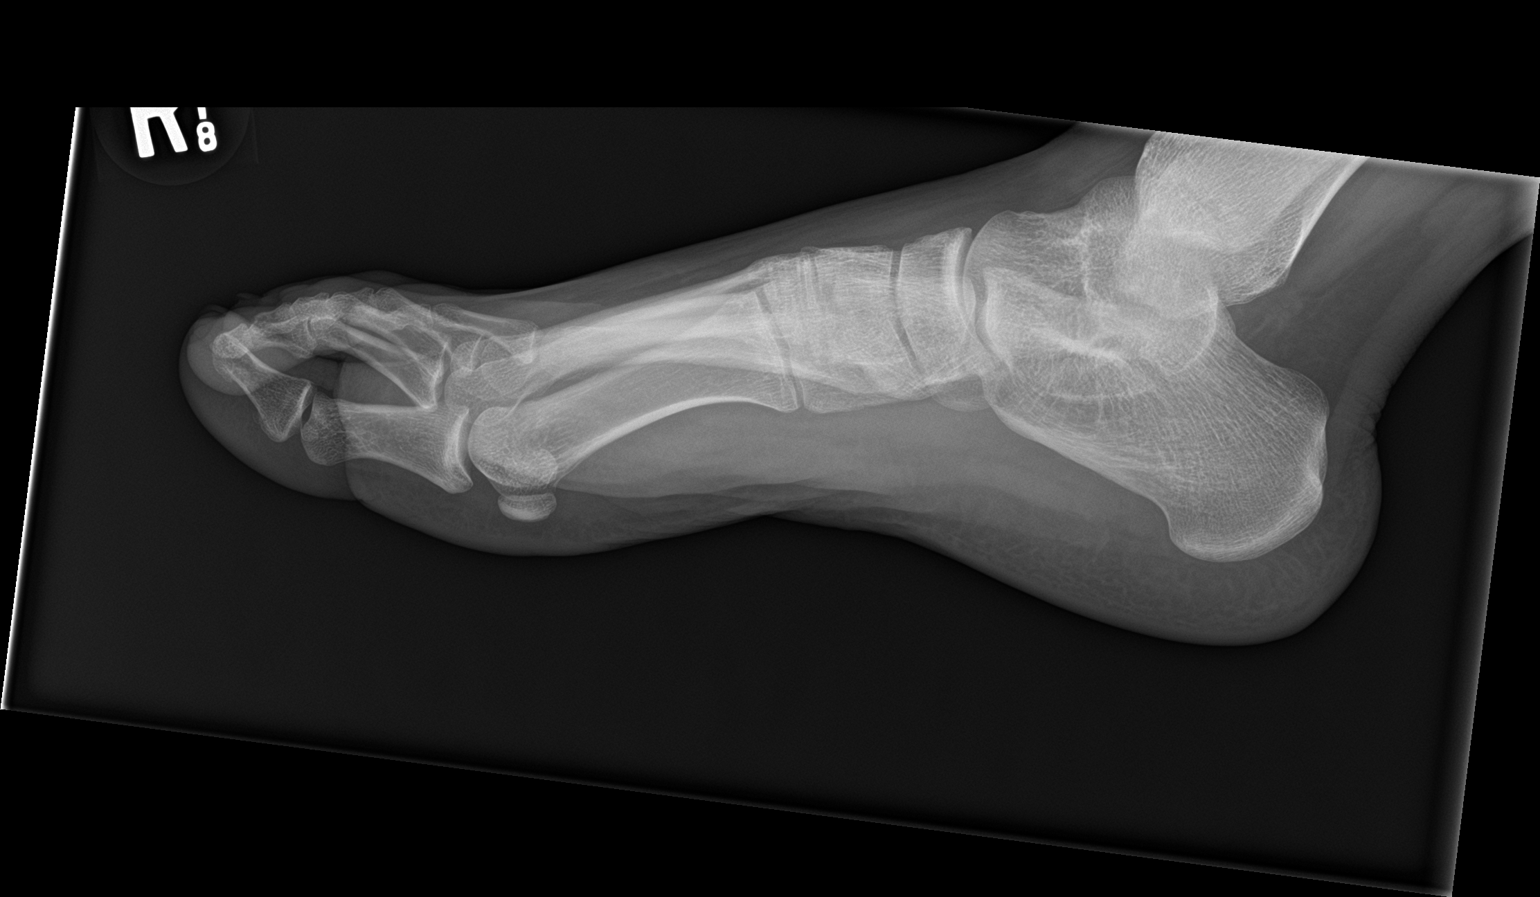

[3 of 3 positions shown; findings below may reference images not displayed]

FINDINGS: There is no evidence of fracture or dislocation. There is no
evidence of arthropathy or other focal bone abnormality. Soft
tissues are unremarkable.
IMPRESSION: Negative.

## 2019-11-29 ENCOUNTER — Other Ambulatory Visit: Payer: Medicaid Other

## 2019-11-29 ENCOUNTER — Other Ambulatory Visit: Payer: Self-pay

## 2019-11-29 DIAGNOSIS — Z20822 Contact with and (suspected) exposure to covid-19: Secondary | ICD-10-CM

## 2019-11-30 LAB — SARS-COV-2, NAA 2 DAY TAT

## 2019-11-30 LAB — NOVEL CORONAVIRUS, NAA: SARS-CoV-2, NAA: NOT DETECTED

## 2020-01-19 ENCOUNTER — Other Ambulatory Visit: Payer: Self-pay

## 2020-01-19 ENCOUNTER — Ambulatory Visit (HOSPITAL_COMMUNITY)
Admission: EM | Admit: 2020-01-19 | Discharge: 2020-01-19 | Disposition: A | Discharge status: Home / Self Care | Payer: HRSA Program | Attending: Emergency Medicine | Admitting: Emergency Medicine

## 2020-01-19 ENCOUNTER — Encounter (HOSPITAL_COMMUNITY): Payer: Self-pay | Admitting: *Deleted

## 2020-01-19 DIAGNOSIS — R059 Cough, unspecified: Secondary | ICD-10-CM | POA: Insufficient documentation

## 2020-01-19 DIAGNOSIS — J029 Acute pharyngitis, unspecified: Secondary | ICD-10-CM | POA: Insufficient documentation

## 2020-01-19 DIAGNOSIS — J069 Acute upper respiratory infection, unspecified: Secondary | ICD-10-CM | POA: Diagnosis not present

## 2020-01-19 DIAGNOSIS — Z20822 Contact with and (suspected) exposure to covid-19: Secondary | ICD-10-CM | POA: Insufficient documentation

## 2020-01-19 DIAGNOSIS — R509 Fever, unspecified: Secondary | ICD-10-CM | POA: Insufficient documentation

## 2020-01-19 LAB — SARS CORONAVIRUS 2 (TAT 6-24 HRS): SARS Coronavirus 2: NEGATIVE

## 2020-01-19 MED ORDER — BENZONATATE 200 MG PO CAPS: 200.0000 mg | ORAL_CAPSULE | Freq: Three times a day (TID) | ORAL | 0 refills | Status: AC | PRN

## 2020-01-19 MED ORDER — FLUTICASONE PROPIONATE 50 MCG/ACT NA SUSP: 1.0000 | Freq: Every day | NASAL | 0 refills | Status: AC

## 2020-01-19 NOTE — ED Provider Notes (Signed)
[facility]    CSN: [id number] Arrival date & time: 01/19/20  1141      History   Chief Complaint Chief Complaint  Patient presents with  . Cough  . Fever    HPI Mackensie Pilson is a 24 y.o. female history of asthma presenting today for evaluation of URI symptoms.  Patient reports over the past 3 to 4 days she has had cough congestion and sore throat.  She has also had subjective fevers and hot and cold chills.  Reports left-sided back discomfort.  Denies chest pain or shortness of breath.  Denies any close sick contacts.  HPI  Past Medical History:  Diagnosis Date  . Asthma     There are no problems to display for this patient.   History reviewed. No pertinent surgical history.  OB History   No obstetric history on file.      Home Medications    Prior to Admission medications   Medication Sig Start Date End Date Taking? Authorizing Provider  acetaminophen (TYLENOL) 325 MG tablet Take 650 mg by mouth every 6 (six) hours as needed for mild pain.   Yes [provider]  ibuprofen (ADVIL,MOTRIN) 600 MG tablet Take 1 tablet (600 mg total) by mouth every 6 (six) hours as needed. 02/13/17  Yes Bethel Born, PA-C  benzonatate (TESSALON) 200 MG capsule Take 1 capsule (200 mg total) by mouth 3 (three) times daily as needed for up to 7 days for cough. 01/19/20 01/26/20  Kortland Nichols C, PA-C  fluticasone (FLONASE) 50 MCG/ACT nasal spray Place 1-2 sprays into both nostrils daily. 01/19/20   Jakita Dutkiewicz, Junius Creamer, PA-C    Family History History reviewed. No pertinent family history.  Social History Social History   Tobacco Use  . Smoking status: Never Smoker  . Smokeless tobacco: Never Used  Substance Use Topics  . Alcohol use: No  . Drug use: No     Allergies   Patient has no known allergies.   Review of Systems Review of Systems  Constitutional: Positive for chills and fever. Negative for activity change, appetite change and fatigue.   HENT: Positive for congestion, rhinorrhea and sore throat. Negative for ear pain, sinus pressure and trouble swallowing.   Eyes: Negative for discharge and redness.  Respiratory: Positive for cough. Negative for chest tightness and shortness of breath.   Cardiovascular: Negative for chest pain.  Gastrointestinal: Negative for abdominal pain, diarrhea, nausea and vomiting.  Musculoskeletal: Negative for myalgias.  Skin: Negative for rash.  Neurological: Negative for dizziness, light-headedness and headaches.     Physical Exam Triage Vital Signs ED Triage Vitals  Enc Vitals Group     BP 01/19/20 1257 117/65     Pulse Rate 01/19/20 1257 66     Resp 01/19/20 1257 20     Temp 01/19/20 1257 98.1 F (36.7 C)     Temp Source 01/19/20 1257 Oral     SpO2 01/19/20 1257 98 %     Weight 01/19/20 1258 158 lb (71.7 kg)     Height 01/19/20 1258 5\' 4"  (1.626 m)     Head Circumference --      Peak Flow --      Pain Score 01/19/20 1258 0     Pain Loc --      Pain Edu? --      Excl. in GC? --    No data found.  Updated Vital Signs BP 117/65 (BP Location: Right Arm)   Pulse 66  Temp 98.1 F (36.7 C) (Oral)   Resp 20   Ht 5\' 4"  (1.626 m)   Wt 158 lb (71.7 kg)   LMP 12/24/2019   SpO2 98%   BMI 27.12 kg/m   Visual Acuity Right Eye Distance:   Left Eye Distance:   Bilateral Distance:    Right Eye Near:   Left Eye Near:    Bilateral Near:     Physical Exam Vitals and nursing note reviewed.  Constitutional:      Appearance: She is well-developed.     Comments: No acute distress  HENT:     Head: Normocephalic and atraumatic.     Ears:     Comments: Bilateral ears without tenderness to palpation of external auricle, tragus and mastoid, EAC's without erythema or swelling, TM's with good bony landmarks and cone of light. Non erythematous.     Nose: Nose normal.  Eyes:     Conjunctiva/sclera: Conjunctivae normal.  Cardiovascular:     Rate and Rhythm: Normal rate and regular  rhythm.  Pulmonary:     Effort: Pulmonary effort is normal. No respiratory distress.     Comments: Breathing comfortably at rest, CTABL, no wheezing, rales or other adventitious sounds auscultated Abdominal:     General: There is no distension.  Musculoskeletal:        General: Normal range of motion.     Cervical back: Neck supple.     Comments: Left lower thoracic area tender to palpation  Skin:    General: Skin is warm and dry.  Neurological:     Mental Status: She is alert and oriented to person, place, and time.      UC Treatments / Results  Labs (all labs ordered are listed, but only abnormal results are displayed) Labs Reviewed  SARS CORONAVIRUS 2 (TAT 6-24 HRS)    EKG   Radiology No results found.  Procedures Procedures (including critical care time)  Medications Ordered in UC Medications - No data to display  Initial Impression / Assessment and Plan / UC Course  I have reviewed the triage vital signs and the nursing notes.  Pertinent labs & imaging results that were available during my care of the patient were reviewed by me and considered in my medical decision making (see chart for details).     Viral URI with cough-exam reassuring, Covid test pending, symptomatic and supportive care rest and fluids.  Monitor for gradual resolution.Discussed strict return precautions. Patient verbalized understanding and is agreeable with plan.  Final Clinical Impressions(s) / UC Diagnoses   Final diagnoses:  Viral URI with cough     Discharge Instructions     Covid test pending Begin daily Flonase nasal spray to help with congestion, may pare with Mucinex or over-the-counter Zyrtec/Claritin Tessalon/benzonatate every 8 hours as needed for cough or may use over-the-counter Robitussin Delsym or Dimetapp Rest and fluids Ibuprofen and Tylenol as needed Follow-up if not improving or worsening    ED Prescriptions    Medication Sig Dispense Auth. Provider    benzonatate (TESSALON) 200 MG capsule Take 1 capsule (200 mg total) by mouth 3 (three) times daily as needed for up to 7 days for cough. 28 capsule Shaleta Ruacho C, PA-C   fluticasone (FLONASE) 50 MCG/ACT nasal spray Place 1-2 sprays into both nostrils daily. 16 g Rohail Klees, [place] C, PA-C     PDMP not reviewed this encounter.   Villa Rodriguez, PA-C 01/19/20 1332

## 2020-01-19 NOTE — ED Triage Notes (Signed)
PT reports Sx started Sunday . Pt reports she has had a fever but not today. Pt also has a dry cough

## 2020-01-19 NOTE — Discharge Instructions (Signed)
Covid test pending Begin daily Flonase nasal spray to help with congestion, may pare with Mucinex or over-the-counter Zyrtec/Claritin Tessalon/benzonatate every 8 hours as needed for cough or may use over-the-counter Robitussin Delsym or Dimetapp Rest and fluids Ibuprofen and Tylenol as needed Follow-up if not improving or worsening

## 2020-02-29 ENCOUNTER — Other Ambulatory Visit: Payer: Medicaid Other

## 2020-02-29 DIAGNOSIS — Z20822 Contact with and (suspected) exposure to covid-19: Secondary | ICD-10-CM

## 2020-03-01 LAB — SARS-COV-2, NAA 2 DAY TAT

## 2020-03-01 LAB — NOVEL CORONAVIRUS, NAA: SARS-CoV-2, NAA: DETECTED — AB

## 2020-06-12 ENCOUNTER — Other Ambulatory Visit: Payer: Self-pay

## 2020-06-12 ENCOUNTER — Emergency Department (HOSPITAL_COMMUNITY)
Admission: EM | Admit: 2020-06-12 | Discharge: 2020-06-12 | Disposition: A | Discharge status: Home / Self Care | Payer: Medicaid Other | Attending: Emergency Medicine | Admitting: Emergency Medicine

## 2020-06-12 DIAGNOSIS — L732 Hidradenitis suppurativa: Secondary | ICD-10-CM

## 2020-06-12 DIAGNOSIS — L0501 Pilonidal cyst with abscess: Secondary | ICD-10-CM

## 2020-06-12 DIAGNOSIS — J45909 Unspecified asthma, uncomplicated: Secondary | ICD-10-CM | POA: Insufficient documentation

## 2020-06-12 MED ORDER — ACETAMINOPHEN 325 MG PO TABS
650.0000 mg | ORAL_TABLET | Freq: Once | ORAL | Status: AC
  Administered 2020-06-12: 650 mg via ORAL
  Filled 2020-06-12: qty 2

## 2020-06-12 MED ORDER — DOXYCYCLINE HYCLATE 100 MG PO CAPS: 100.0000 mg | ORAL_CAPSULE | Freq: Two times a day (BID) | ORAL | 0 refills | Status: AC

## 2020-06-12 MED ORDER — CLINDAMYCIN PHOSPHATE 1 % EX GEL: Freq: Two times a day (BID) | CUTANEOUS | 0 refills | Status: AC

## 2020-06-12 MED ORDER — LIDOCAINE HCL (PF) 1 % IJ SOLN
INTRAMUSCULAR | Status: AC
  Filled 2020-06-12: qty 30

## 2020-06-12 NOTE — ED Triage Notes (Signed)
Pt reports abscess to lower back/buttocks x 1 week. Also has multiple recurrent abscesses to L axilla. Denies fevers or chills.

## 2020-06-12 NOTE — ED Provider Notes (Signed)
[facility] EMERGENCY DEPARTMENT Provider Note   CSN: [id number] Arrival date & time: 06/12/20  6468     History Chief Complaint  Patient presents with  . Abscess    Jasmine Juarez is a 25 y.o. female.  25 year old female presents with complaint of buttock abscess, progressively worsening over the past few days, no history of same previously however states her mom has had a pilonidal abscess before and she believes this is what she has.  Denies fevers, drainage.  Also reports recurrent abscesses in left and right axillary areas, improved today, no drainage.  Not previously diagnosed with hidradenitis.        Past Medical History:  Diagnosis Date  . Asthma     There are no problems to display for this patient.   No past surgical history on file.   OB History   No obstetric history on file.     No family history on file.  Social History   Tobacco Use  . Smoking status: Never Smoker  . Smokeless tobacco: Never Used  Substance Use Topics  . Alcohol use: No  . Drug use: No    Home Medications Prior to Admission medications   Medication Sig Start Date End Date Taking? Authorizing Provider  clindamycin (CLINDAGEL) 1 % gel Apply topically 2 (two) times daily. 06/12/20  Yes Jeannie Fend, PA-C  doxycycline (VIBRAMYCIN) 100 MG capsule Take 1 capsule (100 mg total) by mouth 2 (two) times daily. 06/12/20  Yes Jeannie Fend, PA-C  acetaminophen (TYLENOL) 325 MG tablet Take 650 mg by mouth every 6 (six) hours as needed for mild pain.    [provider]  fluticasone (FLONASE) 50 MCG/ACT nasal spray Place 1-2 sprays into both nostrils daily. 01/19/20   Wieters, Hallie C, PA-C  ibuprofen (ADVIL,MOTRIN) 600 MG tablet Take 1 tablet (600 mg total) by mouth every 6 (six) hours as needed. 02/13/17   Bethel Born, PA-C    Allergies    Patient has no known allergies.  Review of Systems   Review of Systems  Constitutional: Negative for fever.   Gastrointestinal: Negative for abdominal pain, constipation, diarrhea, nausea, rectal pain and vomiting.  Musculoskeletal: Negative for arthralgias and myalgias.  Skin: Negative for wound.  Allergic/Immunologic: Negative for immunocompromised state.  Neurological: Negative for weakness.  Hematological: Negative for adenopathy.  All other systems reviewed and are negative.   Physical Exam Updated Vital Signs BP 128/71 (BP Location: Right Arm)   Pulse (!) 101   Temp 98.2 F (36.8 C) (Oral)   Resp 16   SpO2 100%   Physical Exam Vitals and nursing note reviewed.  Constitutional:      General: She is not in acute distress.    Appearance: She is well-developed. She is not diaphoretic.  HENT:     Head: Normocephalic and atraumatic.  Pulmonary:     Effort: Pulmonary effort is normal.  Musculoskeletal:        General: No swelling or tenderness.  Skin:    General: Skin is warm and dry.     Findings: Erythema present.     Comments: Nodules to bilateral axillary areas without fluctuance, left axillary nodule with slight tenderness.  Induration to midline sacral area with central fluctuance, no drainage.   Neurological:     Mental Status: She is alert and oriented to person, place, and time.     Sensory: No sensory deficit.     Motor: No weakness.  Psychiatric:  Behavior: Behavior normal.     ED Results / Procedures / Treatments   Labs (all labs ordered are listed, but only abnormal results are displayed) Labs Reviewed - No data to display  EKG None  Radiology No results found.  Procedures .Marland KitchenIncision and Drainage  Date/Time: 06/12/2020 10:27 AM Performed by: Jeannie Fend, PA-C Authorized by: Jeannie Fend, PA-C   Consent:    Consent obtained:  Verbal   Consent given by:  Patient   Risks discussed:  Bleeding, incomplete drainage, pain and damage to other organs   Alternatives discussed:  No treatment Universal protocol:    Procedure explained and  questions answered to patient or proxy's satisfaction: yes     Relevant documents present and verified: yes     Test results available : yes     Imaging studies available: yes     Required blood products, implants, devices, and special equipment available: yes     Site/side marked: yes     Immediately prior to procedure, a time out was called: yes     Patient identity confirmed:  Verbally with patient Location:    Type:  Pilonidal cyst   Size:  4cm x 4cm   Location:  Anogenital   Anogenital location:  Pilonidal Pre-procedure details:    Skin preparation:  Betadine Sedation:    Sedation type:  None Anesthesia:    Anesthesia method:  Local infiltration   Local anesthetic:  Lidocaine 1% w/o epi Procedure type:    Complexity:  Complex Procedure details:    Incision types:  Single straight   Incision depth:  Subcutaneous   Wound management:  Probed and deloculated, irrigated with saline and extensive cleaning   Drainage:  Purulent   Drainage amount:  Moderate   Packing materials:  1/4 in iodoform gauze   Amount 1/4" iodoform:  2" Post-procedure details:    Procedure completion:  Tolerated well, no immediate complications     Medications Ordered in ED Medications  lidocaine (PF) (XYLOCAINE) 1 % injection (has no administration in time range)    ED Course  I have reviewed the triage vital signs and the nursing notes.  Pertinent labs & imaging results that were available during my care of the patient were reviewed by me and considered in my medical decision making (see chart for details).  Clinical Course as of 06/12/20 1027  Mon Jun 12, 2020  1025 25 yo female with likely hydradenitis to right and left axillary areas, discussed management. Will give rx for clindamycin topically, advised to see PCP, may need specialty referral.  Pilonidal abscess treated with I&D today with packing placed, started on Doxy, referred to general surgery for follow-up and packing removal.  Given  return to ER precautions. [LM]    Clinical Course User Index [LM] [name]   MDM Rules/Calculators/A&P                          Final Clinical Impression(s) / ED Diagnoses Final diagnoses:  Pilonidal abscess  Hydradenitis    Rx / DC Orders ED Discharge Orders         Ordered    doxycycline (VIBRAMYCIN) 100 MG capsule  2 times daily        06/12/20 1014    clindamycin (CLINDAGEL) 1 % gel  2 times daily        06/12/20 1014           Eulah Pont Gerome Apley,  PA-C 06/12/20 1028    Rolan Bucco, MD 06/12/20 1500

## 2020-06-12 NOTE — Discharge Instructions (Signed)
Take doxycycline as prescribed and complete the full course.  Apply warm compresses to area for 20 minutes at a time. Take Motrin and Tylenol as needed as directed for pain.  Use Clindamycin topically to the armpits as prescribed, you can start this AFTER you finish the Doxycycline.   Follow up with general surgery for recheck and packing removal in 2 days. Return to the ER for fevers, worsening or concerning symptoms.

## 2020-07-05 ENCOUNTER — Ambulatory Visit: Payer: Medicaid Other | Attending: Critical Care Medicine

## 2020-07-05 DIAGNOSIS — Z20822 Contact with and (suspected) exposure to covid-19: Secondary | ICD-10-CM

## 2020-07-06 LAB — NOVEL CORONAVIRUS, NAA: SARS-CoV-2, NAA: NOT DETECTED

## 2020-10-03 ENCOUNTER — Other Ambulatory Visit: Payer: Self-pay

## 2020-10-03 ENCOUNTER — Emergency Department (HOSPITAL_COMMUNITY)
Admission: EM | Admit: 2020-10-03 | Discharge: 2020-10-03 | Disposition: A | Discharge status: Home / Self Care | Payer: Medicaid Other | Attending: Emergency Medicine | Admitting: Emergency Medicine

## 2020-10-03 DIAGNOSIS — Z5321 Procedure and treatment not carried out due to patient leaving prior to being seen by health care provider: Secondary | ICD-10-CM | POA: Insufficient documentation

## 2020-10-03 DIAGNOSIS — R252 Cramp and spasm: Secondary | ICD-10-CM | POA: Insufficient documentation

## 2020-10-03 DIAGNOSIS — R112 Nausea with vomiting, unspecified: Secondary | ICD-10-CM | POA: Insufficient documentation

## 2020-10-03 LAB — I-STAT BETA HCG BLOOD, ED (MC, WL, AP ONLY): I-stat hCG, quantitative: <5 m[IU]/mL (ref <5)

## 2020-10-03 NOTE — ED Triage Notes (Signed)
Pt here wanting to know if she is preg, has been having some n/v  lmp June 10th

## 2020-10-03 NOTE — ED Notes (Signed)
Pt to triage desk, stated she was leaving.

## 2020-10-03 NOTE — ED Provider Notes (Signed)
Emergency Medicine Provider Triage Evaluation Note  Jasmine Juarez , a 25 y.o. female  was evaluated in triage.  Pt complains of nv and cramping, she is concerned she is pregnant.  Review of Systems  Positive: Nv, cramping Negative: fever  Physical Exam  BP 119/77   Pulse 95   Temp 98.9 F (37.2 C) (Oral)   Resp 14   SpO2 99%  Gen:   Awake, no distress   Resp:  Normal effort  MSK:   Moves extremities without difficulty  Other:    Medical Decision Making  Medically screening exam initiated at 5:01 PM.  Appropriate orders placed.  [facility] was informed that the remainder of the evaluation will be completed by another provider, this initial triage assessment does not replace that evaluation, and the importance of remaining in the ED until their evaluation is complete.     Karrie Meres, [place] 10/03/20 1701    Terald Sleeper, MD 10/03/20 [phone]

## 2023-10-31 ENCOUNTER — Encounter (HOSPITAL_BASED_OUTPATIENT_CLINIC_OR_DEPARTMENT_OTHER): Payer: Self-pay | Admitting: Emergency Medicine

## 2023-10-31 ENCOUNTER — Emergency Department (HOSPITAL_BASED_OUTPATIENT_CLINIC_OR_DEPARTMENT_OTHER)
Admission: EM | Admit: 2023-10-31 | Discharge: 2023-10-31 | Disposition: A | Discharge status: Home / Self Care | Payer: Self-pay | Attending: Emergency Medicine | Admitting: Emergency Medicine

## 2023-10-31 ENCOUNTER — Other Ambulatory Visit: Payer: Self-pay

## 2023-10-31 DIAGNOSIS — K1120 Sialoadenitis, unspecified: Secondary | ICD-10-CM | POA: Insufficient documentation

## 2023-10-31 MED ORDER — AMOXICILLIN-POT CLAVULANATE 875-125 MG PO TABS: 1.0000 | ORAL_TABLET | Freq: Two times a day (BID) | ORAL | 0 refills | Status: AC

## 2023-10-31 MED ORDER — IBUPROFEN 600 MG PO TABS: 600.0000 mg | ORAL_TABLET | Freq: Four times a day (QID) | ORAL | 0 refills | Status: AC | PRN

## 2023-10-31 NOTE — ED Provider Notes (Signed)
 [facility] Provider Note   CSN: [id number] Arrival date & time: 10/31/23  1845     Patient presents with: Facial Swelling   Jasmine Juarez is a 28 y.o. female.   The history is provided by the patient and medical records. No language interpreter was used.     28 year old female presenting with complaints of jaw pain.  Patient states yesterday after eating chicken she noticed some swelling to the right side of her jaw.  It seems to improve overnight but today after eating chicken again the swelling returned and is a little bit tender.  She does not endorse any fever no trouble swallowing no tongue pain no dental pain no neck pain and no hearing changes.  She is always chicken before never had any allergic reaction to that.  She denies any trauma to the affected area.  She did take some Benadryl but unsure if it helps.  No runny nose sneezing or coughing.  Prior to Admission medications   Medication Sig Start Date End Date Taking? Authorizing Provider  acetaminophen  (TYLENOL ) 325 MG tablet Take 650 mg by mouth every 6 (six) hours as needed for mild pain.    [provider]  clindamycin  (CLINDAGEL) 1 % gel Apply topically 2 (two) times daily. 06/12/20   Beverley Leita LABOR, PA-C  doxycycline  (VIBRAMYCIN ) 100 MG capsule Take 1 capsule (100 mg total) by mouth 2 (two) times daily. 06/12/20   Beverley Leita LABOR, PA-C  fluticasone  (FLONASE ) 50 MCG/ACT nasal spray Place 1-2 sprays into both nostrils daily. 01/19/20   Wieters, Hallie C, PA-C  ibuprofen  (ADVIL ,MOTRIN ) 600 MG tablet Take 1 tablet (600 mg total) by mouth every 6 (six) hours as needed. 02/13/17   Odis Burnard Jansky, PA-C    Allergies: Patient has no known allergies.    Review of Systems  HENT:  Positive for facial swelling. Negative for dental problem and trouble swallowing.     Updated Vital Signs BP 104/61   Pulse 93   Temp 98.9 F (37.2 C) (Oral)   Resp 18   Ht 5' 4 (1.626  m)   Wt 93 kg   LMP 09/27/2023 (Approximate)   SpO2 100%   BMI 35.19 kg/m   Physical Exam Vitals and nursing note reviewed.  Constitutional:      General: She is not in acute distress.    Appearance: She is well-developed.  HENT:     Head: Atraumatic.     Comments: Reactive lymph node noted to right jawline with mild tenderness to palpation but no erythema edema or warmth noted.    Mouth/Throat:     Comments: No dental tenderness. Eyes:     Conjunctiva/sclera: Conjunctivae normal.  Cardiovascular:     Rate and Rhythm: Normal rate and regular rhythm.     Pulses: Normal pulses.     Heart sounds: Normal heart sounds.  Pulmonary:     Effort: Pulmonary effort is normal.     Breath sounds: No wheezing, rhonchi or rales.  Abdominal:     Palpations: Abdomen is soft.  Musculoskeletal:     Cervical back: Neck supple. No rigidity or tenderness.  Lymphadenopathy:     Cervical: Cervical adenopathy present.  Skin:    Findings: No rash.  Neurological:     Mental Status: She is alert.  Psychiatric:        Mood and Affect: Mood normal.     (all labs ordered are listed, but only abnormal results are  displayed) Labs Reviewed - No data to display  EKG: None  Radiology: No results found.   Procedures   Medications Ordered in the ED - No data to display                                  Medical Decision Making  BP 104/61   Pulse 93   Temp 98.9 F (37.2 C) (Oral)   Resp 18   Ht 5' 4 (1.626 m)   Wt 93 kg   LMP 09/27/2023 (Approximate)   SpO2 100%   BMI 35.19 kg/m   23:74 PM 28 year old female presenting with complaints of jaw pain.  Patient states yesterday after eating chicken she noticed some swelling to the right side of her jaw.  It seems to improve overnight but today after eating chicken again the swelling returned and is a little bit tender.  She does not endorse any fever no trouble swallowing no tongue pain no dental pain no neck pain and no hearing changes.   She is always chicken before never had any allergic reaction to that.  She denies any trauma to the affected area.  She did take some Benadryl but unsure if it helps.  No runny nose sneezing or coughing.  On exam, patient has a palpable nodule noted to her right jawline.  It is mildly tender to palpation.  This is likely a reactive lymph node versus parotitis versus blocked salivary duct versus abscess.  I have low suspicion for this to be an allergic reaction to chicken.  No airway compromise, vital signs normal.  At this time I recommend patient to suck on hard sour candy as it may help with her swelling.  I also will prescribe Augmentin  in the event of parotitis. I recommend watchful waiting before starting abx. I gave patient return precaution.  She voiced understanding and agrees to plan.  Advanced imaging considered but not performed as patient without any systemic symptoms.     Final diagnoses:  Parotitis    ED Discharge Orders          Ordered    amoxicillin -clavulanate (AUGMENTIN ) 875-125 MG tablet  Every 12 hours        10/31/23 2059    ibuprofen  (ADVIL ) 600 MG tablet  Every 6 hours PRN        10/31/23 2059               Nivia Colon, PA-C 10/31/23 2101    Pamella Ozell LABOR, DO 11/07/23 1133

## 2023-10-31 NOTE — Discharge Instructions (Addendum)
 Please suck on hard sour candy several times daily as that will help with your condition.  Take antibiotic as prescribed take ibuprofen  as needed for pain and return if you have any concern.

## 2023-10-31 NOTE — ED Triage Notes (Addendum)
 Pt reports she is getting edema in the RT jaw and neck every time she eats chicken since yesterday, denies difficulty swallowing or ShoB, sore throat, fever, cough or other sxs of infection
